# Patient Record
Sex: Female | Born: 1950 | Race: White | Hispanic: No | Marital: Single | Smoking: Never smoker
Health system: Southern US, Community
[De-identification: ages and names within clinical notes are randomized; demographics above are authoritative.]

## PROBLEM LIST (undated history)

## (undated) DIAGNOSIS — G43909 Migraine, unspecified, not intractable, without status migrainosus: Secondary | ICD-10-CM

## (undated) DIAGNOSIS — F329 Major depressive disorder, single episode, unspecified: Secondary | ICD-10-CM

## (undated) DIAGNOSIS — R87619 Unspecified abnormal cytological findings in specimens from cervix uteri: Secondary | ICD-10-CM

## (undated) DIAGNOSIS — F419 Anxiety disorder, unspecified: Secondary | ICD-10-CM

## (undated) DIAGNOSIS — C50919 Malignant neoplasm of unspecified site of unspecified female breast: Secondary | ICD-10-CM

## (undated) DIAGNOSIS — IMO0002 Reserved for concepts with insufficient information to code with codable children: Secondary | ICD-10-CM

## (undated) DIAGNOSIS — E785 Hyperlipidemia, unspecified: Secondary | ICD-10-CM

## (undated) DIAGNOSIS — F32A Depression, unspecified: Secondary | ICD-10-CM

## (undated) HISTORY — PX: TONSILLECTOMY: SUR1361

## (undated) HISTORY — DX: Hyperlipidemia, unspecified: E78.5

## (undated) HISTORY — DX: Depression, unspecified: F32.A

## (undated) HISTORY — PX: BREAST LUMPECTOMY: SHX2

## (undated) HISTORY — PX: BREAST ENHANCEMENT SURGERY: SHX7

## (undated) HISTORY — DX: Reserved for concepts with insufficient information to code with codable children: IMO0002

## (undated) HISTORY — DX: Anxiety disorder, unspecified: F41.9

## (undated) HISTORY — DX: Unspecified abnormal cytological findings in specimens from cervix uteri: R87.619

## (undated) HISTORY — DX: Malignant neoplasm of unspecified site of unspecified female breast: C50.919

## (undated) HISTORY — DX: Migraine, unspecified, not intractable, without status migrainosus: G43.909

## (undated) HISTORY — DX: Major depressive disorder, single episode, unspecified: F32.9

---

## 2006-12-28 ENCOUNTER — Encounter: Admission: RE | Admit: 2006-12-28 | Discharge: 2006-12-28 | Payer: Self-pay | Admitting: Surgery

## 2007-01-03 ENCOUNTER — Ambulatory Visit: Admission: RE | Admit: 2007-01-03 | Discharge: 2007-04-03 | Payer: Self-pay | Admitting: Radiation Oncology

## 2007-01-04 ENCOUNTER — Encounter: Admission: RE | Admit: 2007-01-04 | Discharge: 2007-01-04 | Payer: Self-pay | Admitting: Surgery

## 2007-01-09 ENCOUNTER — Ambulatory Visit (HOSPITAL_BASED_OUTPATIENT_CLINIC_OR_DEPARTMENT_OTHER): Admission: RE | Admit: 2007-01-09 | Discharge: 2007-01-09 | Payer: Self-pay | Admitting: Obstetrics and Gynecology

## 2007-01-09 ENCOUNTER — Encounter (INDEPENDENT_AMBULATORY_CARE_PROVIDER_SITE_OTHER): Payer: Self-pay | Admitting: Surgery

## 2007-01-09 ENCOUNTER — Ambulatory Visit (HOSPITAL_COMMUNITY): Admission: RE | Admit: 2007-01-09 | Discharge: 2007-01-09 | Payer: Self-pay | Admitting: Surgery

## 2007-01-24 ENCOUNTER — Ambulatory Visit: Payer: Self-pay | Admitting: Oncology

## 2007-01-31 LAB — CBC WITH DIFFERENTIAL/PLATELET
BASO%: 0.6 % (ref 0.0–2.0)
Basophils Absolute: 0 10*3/uL (ref 0.0–0.1)
EOS%: 2.4 % (ref 0.0–7.0)
Eosinophils Absolute: 0.1 10*3/uL (ref 0.0–0.5)
HCT: 37.6 % (ref 34.8–46.6)
HGB: 13.2 g/dL (ref 11.6–15.9)
LYMPH%: 35.9 % (ref 14.0–48.0)
MCH: 30.6 pg (ref 26.0–34.0)
MCHC: 35.2 g/dL (ref 32.0–36.0)
MCV: 86.8 fL (ref 81.0–101.0)
MONO#: 0.5 10*3/uL (ref 0.1–0.9)
MONO%: 8.1 % (ref 0.0–13.0)
NEUT#: 3 10*3/uL (ref 1.5–6.5)
NEUT%: 53 % (ref 39.6–76.8)
Platelets: 295 10*3/uL (ref 145–400)
RBC: 4.33 10*6/uL (ref 3.70–5.32)
RDW: 13.3 % (ref 11.3–14.5)
WBC: 5.8 10*3/uL (ref 3.9–10.0)
lymph#: 2.1 10*3/uL (ref 0.9–3.3)

## 2007-02-05 LAB — COMPREHENSIVE METABOLIC PANEL
ALT: 20 U/L (ref 0–35)
AST: 17 U/L (ref 0–37)
Albumin: 4.9 g/dL (ref 3.5–5.2)
Alkaline Phosphatase: 51 U/L (ref 39–117)
BUN: 18 mg/dL (ref 6–23)
CO2: 26 mEq/L (ref 19–32)
Calcium: 10.2 mg/dL (ref 8.4–10.5)
Chloride: 104 mEq/L (ref 96–112)
Creatinine, Ser: 0.75 mg/dL (ref 0.40–1.20)
Glucose, Bld: 86 mg/dL (ref 70–99)
Potassium: 4.2 mEq/L (ref 3.5–5.3)
Sodium: 139 mEq/L (ref 135–145)
Total Bilirubin: 0.6 mg/dL (ref 0.3–1.2)
Total Protein: 7.2 g/dL (ref 6.0–8.3)

## 2007-02-05 LAB — LACTATE DEHYDROGENASE: LDH: 182 U/L (ref 94–250)

## 2007-02-05 LAB — VITAMIN D PNL(25-HYDRXY+1,25-DIHY)-BLD
Vit D, 1,25-Dihydroxy: 43 pg/mL (ref 6–62)
Vit D, 25-Hydroxy: 29 ng/mL (ref 20–57)

## 2007-02-05 LAB — CANCER ANTIGEN 27.29: CA 27.29: 14 U/mL (ref 0–39)

## 2007-02-18 ENCOUNTER — Emergency Department (HOSPITAL_COMMUNITY): Admission: EM | Admit: 2007-02-18 | Discharge: 2007-02-18 | Payer: Self-pay | Admitting: Emergency Medicine

## 2007-05-25 ENCOUNTER — Encounter (INDEPENDENT_AMBULATORY_CARE_PROVIDER_SITE_OTHER): Payer: Self-pay | Admitting: Specialist

## 2007-05-25 ENCOUNTER — Ambulatory Visit (HOSPITAL_BASED_OUTPATIENT_CLINIC_OR_DEPARTMENT_OTHER): Admission: RE | Admit: 2007-05-25 | Discharge: 2007-05-25 | Payer: Self-pay | Admitting: Specialist

## 2007-05-25 ENCOUNTER — Ambulatory Visit: Payer: Self-pay | Admitting: Oncology

## 2007-07-30 ENCOUNTER — Ambulatory Visit: Payer: Self-pay | Admitting: Psychiatry

## 2007-08-06 ENCOUNTER — Ambulatory Visit: Payer: Self-pay | Admitting: Psychiatry

## 2007-08-20 ENCOUNTER — Ambulatory Visit: Payer: Self-pay | Admitting: Psychiatry

## 2007-09-03 ENCOUNTER — Ambulatory Visit: Payer: Self-pay | Admitting: Psychiatry

## 2007-10-18 ENCOUNTER — Ambulatory Visit: Payer: Self-pay | Admitting: Oncology

## 2007-11-27 LAB — COMPREHENSIVE METABOLIC PANEL
ALT: 18 U/L (ref 0–35)
AST: 21 U/L (ref 0–37)
Albumin: 4.5 g/dL (ref 3.5–5.2)
Alkaline Phosphatase: 56 U/L (ref 39–117)
BUN: 14 mg/dL (ref 6–23)
CO2: 28 mEq/L (ref 19–32)
Calcium: 9.8 mg/dL (ref 8.4–10.5)
Chloride: 102 mEq/L (ref 96–112)
Creatinine, Ser: 0.71 mg/dL (ref 0.40–1.20)
Glucose, Bld: 81 mg/dL (ref 70–99)
Potassium: 4.1 mEq/L (ref 3.5–5.3)
Sodium: 138 mEq/L (ref 135–145)
Total Bilirubin: 0.8 mg/dL (ref 0.3–1.2)
Total Protein: 7.2 g/dL (ref 6.0–8.3)

## 2007-11-27 LAB — CBC WITH DIFFERENTIAL/PLATELET
BASO%: 0.8 % (ref 0.0–2.0)
Basophils Absolute: 0 10*3/uL (ref 0.0–0.1)
EOS%: 1.4 % (ref 0.0–7.0)
Eosinophils Absolute: 0.1 10*3/uL (ref 0.0–0.5)
HCT: 38.1 % (ref 34.8–46.6)
HGB: 13.2 g/dL (ref 11.6–15.9)
LYMPH%: 29.2 % (ref 14.0–48.0)
MCH: 29.7 pg (ref 26.0–34.0)
MCHC: 34.7 g/dL (ref 32.0–36.0)
MCV: 85.6 fL (ref 81.0–101.0)
MONO#: 0.4 10*3/uL (ref 0.1–0.9)
MONO%: 8.1 % (ref 0.0–13.0)
NEUT#: 2.6 10*3/uL (ref 1.5–6.5)
NEUT%: 60.5 % (ref 39.6–76.8)
Platelets: 260 10*3/uL (ref 145–400)
RBC: 4.45 10*6/uL (ref 3.70–5.32)
RDW: 13.6 % (ref 11.3–14.5)
WBC: 4.3 10*3/uL (ref 3.9–10.0)
lymph#: 1.3 10*3/uL (ref 0.9–3.3)

## 2007-11-28 LAB — FOLLICLE STIMULATING HORMONE: FSH: 69.9 m[IU]/mL

## 2007-11-28 LAB — VITAMIN D 25 HYDROXY (VIT D DEFICIENCY, FRACTURES): Vit D, 25-Hydroxy: 40 ng/mL (ref 30–89)

## 2007-11-28 LAB — CANCER ANTIGEN 27.29: CA 27.29: 21 U/mL (ref 0–39)

## 2007-12-05 LAB — ESTRADIOL, ULTRA SENS

## 2008-11-17 ENCOUNTER — Ambulatory Visit: Payer: Self-pay | Admitting: Oncology

## 2008-11-19 LAB — CBC WITH DIFFERENTIAL/PLATELET
BASO%: 0.7 % (ref 0.0–2.0)
Basophils Absolute: 0 10*3/uL (ref 0.0–0.1)
EOS%: 3.3 % (ref 0.0–7.0)
Eosinophils Absolute: 0.1 10*3/uL (ref 0.0–0.5)
HCT: 37.7 % (ref 34.8–46.6)
HGB: 13.3 g/dL (ref 11.6–15.9)
LYMPH%: 26.2 % (ref 14.0–49.7)
MCH: 30.8 pg (ref 25.1–34.0)
MCHC: 35.2 g/dL (ref 31.5–36.0)
MCV: 87.4 fL (ref 79.5–101.0)
MONO#: 0.3 10*3/uL (ref 0.1–0.9)
MONO%: 7.5 % (ref 0.0–14.0)
NEUT#: 2.5 10*3/uL (ref 1.5–6.5)
NEUT%: 62.3 % (ref 38.4–76.8)
Platelets: 230 10*3/uL (ref 145–400)
RBC: 4.31 10*6/uL (ref 3.70–5.45)
RDW: 13.3 % (ref 11.2–14.5)
WBC: 4.1 10*3/uL (ref 3.9–10.3)
lymph#: 1.1 10*3/uL (ref 0.9–3.3)

## 2008-11-20 LAB — COMPREHENSIVE METABOLIC PANEL
ALT: 14 U/L (ref 0–35)
AST: 17 U/L (ref 0–37)
Albumin: 4.5 g/dL (ref 3.5–5.2)
Alkaline Phosphatase: 54 U/L (ref 39–117)
BUN: 15 mg/dL (ref 6–23)
CO2: 26 mEq/L (ref 19–32)
Calcium: 9.7 mg/dL (ref 8.4–10.5)
Chloride: 105 mEq/L (ref 96–112)
Creatinine, Ser: 0.74 mg/dL (ref 0.40–1.20)
Glucose, Bld: 54 mg/dL — ABNORMAL LOW (ref 70–99)
Potassium: 4.7 mEq/L (ref 3.5–5.3)
Sodium: 140 mEq/L (ref 135–145)
Total Bilirubin: 0.4 mg/dL (ref 0.3–1.2)
Total Protein: 6.7 g/dL (ref 6.0–8.3)

## 2008-11-20 LAB — FOLLICLE STIMULATING HORMONE: FSH: 63.5 m[IU]/mL

## 2008-11-20 LAB — VITAMIN D 25 HYDROXY (VIT D DEFICIENCY, FRACTURES): Vit D, 25-Hydroxy: 44 ng/mL (ref 30–89)

## 2009-06-01 ENCOUNTER — Ambulatory Visit (HOSPITAL_BASED_OUTPATIENT_CLINIC_OR_DEPARTMENT_OTHER): Admission: RE | Admit: 2009-06-01 | Discharge: 2009-06-01 | Payer: Self-pay | Admitting: Specialist

## 2009-07-29 ENCOUNTER — Other Ambulatory Visit: Admission: RE | Admit: 2009-07-29 | Discharge: 2009-07-29 | Payer: Self-pay | Admitting: Family Medicine

## 2009-11-13 ENCOUNTER — Ambulatory Visit: Payer: Self-pay | Admitting: Oncology

## 2009-11-16 LAB — CBC WITH DIFFERENTIAL/PLATELET
BASO%: 0.4 % (ref 0.0–2.0)
Basophils Absolute: 0 10*3/uL (ref 0.0–0.1)
EOS%: 1.1 % (ref 0.0–7.0)
Eosinophils Absolute: 0.1 10*3/uL (ref 0.0–0.5)
HCT: 36.7 % (ref 34.8–46.6)
HGB: 12.5 g/dL (ref 11.6–15.9)
LYMPH%: 25.4 % (ref 14.0–49.7)
MCH: 30.3 pg (ref 25.1–34.0)
MCHC: 34.1 g/dL (ref 31.5–36.0)
MCV: 88.9 fL (ref 79.5–101.0)
MONO#: 0.5 10*3/uL (ref 0.1–0.9)
MONO%: 6.6 % (ref 0.0–14.0)
NEUT#: 5 10*3/uL (ref 1.5–6.5)
NEUT%: 66.5 % (ref 38.4–76.8)
Platelets: 226 10*3/uL (ref 145–400)
RBC: 4.13 10*6/uL (ref 3.70–5.45)
RDW: 14.2 % (ref 11.2–14.5)
WBC: 7.6 10*3/uL (ref 3.9–10.3)
lymph#: 1.9 10*3/uL (ref 0.9–3.3)

## 2009-11-17 LAB — COMPREHENSIVE METABOLIC PANEL
ALT: 14 U/L (ref 0–35)
AST: 18 U/L (ref 0–37)
Albumin: 4.6 g/dL (ref 3.5–5.2)
Alkaline Phosphatase: 59 U/L (ref 39–117)
BUN: 13 mg/dL (ref 6–23)
CO2: 23 mEq/L (ref 19–32)
Calcium: 9.5 mg/dL (ref 8.4–10.5)
Chloride: 103 mEq/L (ref 96–112)
Creatinine, Ser: 0.9 mg/dL (ref 0.40–1.20)
Glucose, Bld: 80 mg/dL (ref 70–99)
Potassium: 4 mEq/L (ref 3.5–5.3)
Sodium: 138 mEq/L (ref 135–145)
Total Bilirubin: 0.4 mg/dL (ref 0.3–1.2)
Total Protein: 6.6 g/dL (ref 6.0–8.3)

## 2009-11-17 LAB — VITAMIN D 25 HYDROXY (VIT D DEFICIENCY, FRACTURES): Vit D, 25-Hydroxy: 47 ng/mL (ref 30–89)

## 2009-11-17 LAB — CANCER ANTIGEN 27.29: CA 27.29: 14 U/mL (ref 0–39)

## 2010-07-04 ENCOUNTER — Encounter: Payer: Self-pay | Admitting: Surgery

## 2010-09-13 LAB — POCT HEMOGLOBIN-HEMACUE: Hemoglobin: 12.6 g/dL (ref 12.0–15.0)

## 2010-10-26 NOTE — Op Note (Signed)
NAMEYUKARI, FLAX               ACCOUNT NO.:  [id number]   MEDICAL RECORD NO.:  [id number]          PATIENT TYPE:  OUT   LOCATION:  [facility]                          FACILITY:  [facility]   PHYSICIAN:  Sandria Bales. Ezzard Standing, M.D.  DATE OF BIRTH:  1950/11/27   DATE OF PROCEDURE:  01/09/2007  DATE OF DISCHARGE:                               OPERATIVE REPORT   PREOPERATIVE DIAGNOSIS:  Carcinoma, left breast, and two foci at 10:30  and 12 o'clock position.   POSTOPERATIVE DIAGNOSIS:  Carcinoma of the left breast and two foci at  10:30 and 12 o'clock position.   PROCEDURE:  Injection of methylene blue approximately 2 mL of 40%  methylene blue, left sentinel lymph node biopsy, left breast lumpectomy  with needle localization x2.   ANESTHESIA:  General laryngeal mask airway with 18 mL of 0.25% Marcaine.   COMPLICATIONS:  None.   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   She has had prior breast implants which are subpectoral placed by Dr.  Marijean Niemann.  She is interested in preserving her breast implants  and trying a lumpectomy.   The indications, potential complications explained to the patient.  The  potential complications include but are not limited to bleeding,  infection, residual cancer, and incomplete excision.   I also discussed with her if we did a needle localization that if the  sentinel lymph node was positive, she would have a full axillary node  dissection.  She understands the risks of lymphedema, axillary nerve  injury.   OPERATIVE NOTE:  The patient had two wires placed in the upper aspect of  her left breast with Xs over the approximate tumors by Dr. Isaiah Serge.  I  used these as my guides for my excision.  I prepped her left breast and  axilla with Betadine  solution, gave her 1 gram of Ancef and initiated  the procedure.  A timeout was held identifying the procedure and the  patient.   I started with the breast itself.  I excised and I basically did a  quadrantectomy of the upper inner aspect of her left breast taking out  all the way down to the chest wall.  Again the implant was subpectoral  so I did not see the implant.  I went medial to the lateral edge of the  sternum, superiorly to about 2 fingerbreadths below the clavicle.  I  marked this specimen with a long suture cranial, short suture medial.  She has the guide wires coming out through the anterior portion of the  skin noting this.  I sent the specimen to Dr. Isaiah Serge to confirm the  metal markers had been excised and Dr. Guerry Bruin did touch preps  confirming that the touch preps were negative.   I then turned my attention to the axillary lymph node, identified, made  an incision in the  left axilla, carried it down to the axillary fat pad.  She had a 1 millicurie of technitium tagged sulfa colloid injection at  the initiation of procedure.  Again I used 2 mL of 40% methylene blue to  inject subareolar.   I made an incision in the lower axilla and I found a lymph node, which  was hot and blue (with counts of 600 in a background of 5).  I excised  the lymph node for  touch prep.  Touch prep reported by Dr. Nonda Lou  showed no cancer.   After getting complete reports, I then irrigated both wounds, closed  them with 3-0 Vicryl suture and a subcuticular suture, 5-0 Monocryl  suturing the skin, painted with tincture of benzoin and Steri-Stripped  them.   Ms. Mangal will have somewhat of a defect in the upper aspect of her left  breast since I did this quadrant resection of her breast.  I have to see  what the final pathology was and how she does as to whether she will  need any further surgery there.  Again, the left axilla surgery went  well.  Sponge and needle count were correct  at the end of the case.   She will be discharged home today, return to see me in one week for her  followup pathology.  She will call for any interval problem.      Sandria Bales. Ezzard Standing, M.D.  Electronically Signed     DHN/MEDQ  D:  01/09/2007  T:  01/09/2007  Job:  [id number]   cc:   Clovis Riley, NP  Lorra Hals, M.D.

## 2010-10-26 NOTE — Op Note (Signed)
Andrea Villa, Andrea Villa               ACCOUNT NO.:  [id number]   MEDICAL RECORD NO.:  [id number]          PATIENT TYPE:  AMB   LOCATION:  [facility]                          FACILITY:  [facility]   PHYSICIAN:  Earvin Hansen L. Shon Hough, M.D.DATE OF BIRTH:  10/11/1950   DATE OF PROCEDURE:  05/25/2007  DATE OF DISCHARGE:  05/25/2007                               OPERATIVE REPORT   HISTORY:  A 60 year old lady with biopsy proven left breast cancer.  The  patient had a lumpectomy, radical lumpectomy with radiation treatments  and chemotherapy.  Now has total asymmetry involving her right and left  breast areas with the right breast being completely larger.  She had  breast augmentation in 1995.  Plans, remove implant on the left side,  replace the tissue expansion to expand up the discrepancy of the tissue  loss on the left side as compared to the right after the lumpectomy and  irradiation treatments.  The procedure in detail were explained to the  patient preoperatively.  The patient understands and consents to  surgery.  She understands the possible risks and complications.   SURGEON:  Yaakov Guthrie. Shon Hough, M.D.   PROCEDURE IN DETAIL:  The patient was sat up and drawn for the midline  of the chest.  She underwent general anesthesia and intubated orally.  Prep was done to the chest and breast areas with Betadine soap and  solution and walled off with sterile towels and drapes such as to make a  sterile field.  Previous supra-areolar incision was entered after 0.5%  Xylocaine with epinephrine was injected locally for vasoconstriction.  Dissection was carried down through skin, subcutaneous tissue, breast  tissue to underlying pectoralis major muscle.  The muscle was divided in  the rectus fibers to an underlying implant which was removed.  It was a  total saline implant and had great integrity, no problems.  It measured  300 mL.  Next, we did some limited capsulotomies over the medial portion  as well as  the radiation area incision area in the upper quadrant.  After proper hemostasis the tissue expander Mentor type Spectrum  reference number [id number] was placed in.  I was able to place back 500 mL  within it without any problems with excellent symmetry.  We put the port  in the left lateral breast axillary region and secured it with 3-0  Vicryl.  Next the muscles repaired back with 2-0 Monocryl.  Subcutaneous  tissue 2-0 Monocryl x2 layers and running subcuticular stitch of 3-0  Monocryl.  At the end of procedure with good symmetry which did have a  ways to go in the upper quadrant medially but we will allow the tissues  to soften and stretch more.  I feel we can get very, very close to the  symmetry and shape of the right breast.  The patient was taken to  recovery after sterile dressings were placed including Xeroform, 4x4s,  ABDs, Hypafix tape.  Estimated blood loss less than 150 mL.  Complications none.      Yaakov Guthrie. Shon Hough, M.D.  Electronically Signed  GLT/MEDQ  D:  05/25/2007  T:  05/26/2007  Job:  [id number]

## 2011-03-21 LAB — POCT HEMOGLOBIN-HEMACUE
Hemoglobin: 12.4
Operator id: 128471

## 2011-03-25 LAB — POCT CARDIAC MARKERS
CKMB, poc: 1.2
Myoglobin, poc: 36.7
Operator id: 4074
Troponin i, poc: <0.05

## 2011-03-25 LAB — CBC
HCT: 37.4
Hemoglobin: 12.8
MCHC: 34.3
MCV: 87
Platelets: 270
RBC: 4.31
RDW: 14
WBC: 9

## 2011-03-25 LAB — DIFFERENTIAL
Basophils Absolute: 0
Basophils Relative: 0
Eosinophils Absolute: 0
Eosinophils Relative: 0
Lymphocytes Relative: 11 — ABNORMAL LOW
Lymphs Abs: 1
Monocytes Absolute: 0.8 — ABNORMAL HIGH
Monocytes Relative: 9
Neutro Abs: 7.2
Neutrophils Relative %: 80 — ABNORMAL HIGH

## 2011-03-25 LAB — BASIC METABOLIC PANEL
BUN: 10
CO2: 27
Calcium: 9.6
Chloride: 103
Creatinine, Ser: 0.55
GFR calc Af Amer: >60
GFR calc non Af Amer: >60
Glucose, Bld: 107 — ABNORMAL HIGH
Potassium: 3.8
Sodium: 137

## 2011-03-25 LAB — PROTIME-INR
INR: 1
Prothrombin Time: 13.4

## 2011-03-25 LAB — APTT: aPTT: 25

## 2011-03-28 LAB — DIFFERENTIAL
Basophils Absolute: 0
Basophils Relative: 1
Eosinophils Absolute: 0.1
Eosinophils Relative: 1
Lymphocytes Relative: 37
Lymphs Abs: 1.9
Monocytes Absolute: 0.4
Monocytes Relative: 8
Neutro Abs: 2.8
Neutrophils Relative %: 54

## 2011-03-28 LAB — COMPREHENSIVE METABOLIC PANEL
ALT: 31
AST: 27
Albumin: 4.3
Alkaline Phosphatase: 39
BUN: 10
CO2: 29
Calcium: 9.7
Chloride: 104
Creatinine, Ser: 0.63
GFR calc Af Amer: >60
GFR calc non Af Amer: >60
Glucose, Bld: 91
Potassium: 4.3
Sodium: 139
Total Bilirubin: 0.5
Total Protein: 6.6

## 2011-03-28 LAB — CBC
HCT: 38
Hemoglobin: 12.9
MCHC: 33.8
MCV: 88.1
Platelets: 260
RBC: 4.31
RDW: 13.3
WBC: 5.3

## 2011-08-03 ENCOUNTER — Encounter: Payer: Self-pay | Admitting: Oncology

## 2011-08-03 NOTE — Progress Notes (Signed)
Andrea Villa called because she wants to apply for financial assistance. Andrea Villa will come in tomorrow with the most recent (30days)  proof of income and bank statement to apply for assistance.

## 2011-08-04 ENCOUNTER — Encounter: Payer: Self-pay | Admitting: Oncology

## 2011-08-04 NOTE — Progress Notes (Signed)
Pt came in with proof of income and bank statement. Advised Andrea Villa we will process her application and will follow up with a letter for her status.

## 2011-08-19 ENCOUNTER — Encounter: Payer: Self-pay | Admitting: Oncology

## 2011-09-12 ENCOUNTER — Encounter: Payer: Self-pay | Admitting: Oncology

## 2011-09-12 NOTE — Progress Notes (Signed)
Patient approved for 100% EPP discount, for family of 1 income [phone]

## 2011-09-19 ENCOUNTER — Other Ambulatory Visit (HOSPITAL_BASED_OUTPATIENT_CLINIC_OR_DEPARTMENT_OTHER): Payer: Self-pay | Admitting: Lab

## 2011-09-19 ENCOUNTER — Encounter: Payer: Self-pay | Admitting: Oncology

## 2011-09-19 DIAGNOSIS — C50219 Malignant neoplasm of upper-inner quadrant of unspecified female breast: Secondary | ICD-10-CM

## 2011-09-19 DIAGNOSIS — M899 Disorder of bone, unspecified: Secondary | ICD-10-CM

## 2011-09-19 DIAGNOSIS — Z170 Estrogen receptor positive status [ER+]: Secondary | ICD-10-CM

## 2011-09-19 LAB — CBC WITH DIFFERENTIAL/PLATELET
BASO%: 0.9 % (ref 0.0–2.0)
Basophils Absolute: 0 10*3/uL (ref 0.0–0.1)
EOS%: 1.4 % (ref 0.0–7.0)
Eosinophils Absolute: 0.1 10*3/uL (ref 0.0–0.5)
HCT: 36.4 % (ref 34.8–46.6)
HGB: 12.3 g/dL (ref 11.6–15.9)
LYMPH%: 38.3 % (ref 14.0–49.7)
MCH: 29.8 pg (ref 25.1–34.0)
MCHC: 33.8 g/dL (ref 31.5–36.0)
MCV: 88.3 fL (ref 79.5–101.0)
MONO#: 0.4 10*3/uL (ref 0.1–0.9)
MONO%: 7.1 % (ref 0.0–14.0)
NEUT#: 2.7 10*3/uL (ref 1.5–6.5)
NEUT%: 52.3 % (ref 38.4–76.8)
Platelets: 236 10*3/uL (ref 145–400)
RBC: 4.13 10*6/uL (ref 3.70–5.45)
RDW: 13.8 % (ref 11.2–14.5)
WBC: 5.1 10*3/uL (ref 3.9–10.3)
lymph#: 2 10*3/uL (ref 0.9–3.3)
nRBC: 0 % (ref 0–0)

## 2011-09-19 NOTE — Progress Notes (Signed)
Patient came by my office today inquiring about not recieveing any news as to whether she qualified for Financial assistance. I let the patient know that I had mailed her a letter out on 09/12/11 she stated that she had not received the letter so we went over address. Patient address had changed so we corrected the address but I gave her the green card today for 100% discount.

## 2011-09-20 LAB — COMPREHENSIVE METABOLIC PANEL
ALT: 12 U/L (ref 0–35)
AST: 16 U/L (ref 0–37)
Albumin: 4.2 g/dL (ref 3.5–5.2)
Alkaline Phosphatase: 57 U/L (ref 39–117)
BUN: 13 mg/dL (ref 6–23)
CO2: 26 mEq/L (ref 19–32)
Calcium: 9.7 mg/dL (ref 8.4–10.5)
Chloride: 105 mEq/L (ref 96–112)
Creatinine, Ser: 0.69 mg/dL (ref 0.50–1.10)
Glucose, Bld: 96 mg/dL (ref 70–99)
Potassium: 3.9 mEq/L (ref 3.5–5.3)
Sodium: 137 mEq/L (ref 135–145)
Total Bilirubin: 0.4 mg/dL (ref 0.3–1.2)
Total Protein: 6.6 g/dL (ref 6.0–8.3)

## 2011-09-20 LAB — CANCER ANTIGEN 27.29: CA 27.29: 21 U/mL (ref 0–39)

## 2011-09-20 LAB — VITAMIN D 25 HYDROXY (VIT D DEFICIENCY, FRACTURES): Vit D, 25-Hydroxy: 58 ng/mL (ref 30–89)

## 2011-09-26 ENCOUNTER — Ambulatory Visit (HOSPITAL_BASED_OUTPATIENT_CLINIC_OR_DEPARTMENT_OTHER): Payer: Self-pay | Admitting: Oncology

## 2011-09-26 VITALS — BP 110/64 | HR 87 | Temp 98.5°F | Ht 60.0 in | Wt 134.5 lb

## 2011-09-26 DIAGNOSIS — Z170 Estrogen receptor positive status [ER+]: Secondary | ICD-10-CM

## 2011-09-26 DIAGNOSIS — C50219 Malignant neoplasm of upper-inner quadrant of unspecified female breast: Secondary | ICD-10-CM

## 2011-09-26 DIAGNOSIS — C50919 Malignant neoplasm of unspecified site of unspecified female breast: Secondary | ICD-10-CM | POA: Insufficient documentation

## 2011-09-26 NOTE — Progress Notes (Signed)
ID: Andrea Villa   DOB: January 26, 1951  MR#: [id number]  NWG#:[id number]  HISTORY OF PRESENT ILLNESS:  She had a screening mammogram at [facility] on December 05, 2006 which showed a focal area of asymmetry in the left breast.  This was changed to a diagnostic mammogram and then additional views showed a 1.7 cm to 2 cm lesion, which by ultrasound was not demonstrable.  Breast specific gamma imaging was according performed on June 25 and showed a low level focal isotope activity in two adjacent areas just anterior to the patient's breast implant.  Ultrasound guided core biopsy was performed on June 25 and showed ([id number]).  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  Clips were placed at the time of the procedure and on July 17 the patient underwent bilateral breast MRIs.  Again in the upper inner quadrant of the left breast there were two enhancing masses measuring maximally 1.1 cm each, otherwise the axilla and the right breast were unremarkable.  With this information on July 29, Andrea Villa proceeded to left lumpectomy and sentinel lymph node biopsy with a final pathology ([id number]) showing two tubular breast cancers measuring 9 mm and 11 mm, both grade 1 with no evidence of lymphovascular invasion with 0 of 1 sentinel lymph node involved with ample margins and both estrogen receptor positive (90 and 99%), progesterone receptor positive (6 and 99%), with a low proliferation fraction (7 and 9%), and both negative for HER-2/neu by Hercep test (both 0). Her subsequent history is as detailed below  INTERVAL HISTORY: She has not been here in more than a year and is also behind on her mammography because of financial reasons. Family is fine, and the interval history is otherwise unremarkable  REVIEW OF SYSTEMS: No unusual headaches nausea vomiting change in bowel or bladder habits pain unexplained weight loss unexplained fatigue, fever or rash bleeding or or other symptoms of concern. She has  developed mild hidradenitis bilaterally. Otherwise detailed review of systems today was noncontributory  PAST MEDICAL HISTORY: Significant for bilateral breast implant placement under Dr. Marijean Niemann.  She is status post removal of precancerous skin lesion from the abdomen in 1995.  I do not have that pathology.  She is status post tonsillectomy and adenoidectomy, history of migraines, history of palpitations, and history of hypercholesterolemia.  FAMILY HISTORY The patient's father died from alcoholism in his 25s.  The patient's mother is alive at age 71.  She lives in [place].  The patient has two sisters and five brothers.  The patient's mother had breast cancer and is doing fine.  This was postmenopausal diagnosis.  There is no other breast or ovarian cancer in the family.  GYNECOLOGIC HISTORY: She is G6, P3, first pregnancy at age 48.  Last menstrual period about eight years ago.  She took oral contraceptive for several years and then low estrogen and then Prempro, which she just quit at the time of her breast cancer diagnosis.   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  She is divorced and lives by herself.  Her daughter Andrea Villa in [place] works at [employer].  In general, her son Andrea Villa in [place] works at a labeling company and her daughter Andrea Villa in [place] works for a company the name of which she does not remember.  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  The patient has a granddaughter that she is very fond  of.  She was brought up as a baptist, but is not currently a church attender.:     ADVANCED DIRECTIVES:  HEALTH MAINTENANCE: History  Substance Use Topics  . Smoking status: Not on file  . Smokeless tobacco: Not on file  . Alcohol Use: Not on file     No Known Allergies  Current Outpatient Prescriptions  Medication Sig Dispense Refill  . anastrozole (ARIMIDEX) 1 MG  tablet Take 1 mg by mouth daily.      Marland Kitchen buPROPion (WELLBUTRIN SR) 150 MG 12 hr tablet Take 150 mg by mouth 2 (two) times daily.      . Calcium Carbonate-Vitamin D (CALCIUM + D PO) Take by mouth daily. Citracal      . cetirizine (ZYRTEC) 10 MG tablet Take 10 mg by mouth daily.      Marland Kitchen HYDROcodone-acetaminophen (NORCO) 5-325 MG per tablet Take 1 tablet by mouth every 6 (six) hours as needed.      Marland Kitchen ibuprofen (ADVIL,MOTRIN) 200 MG tablet Take 200 mg by mouth as needed.      . rosuvastatin (CRESTOR) 10 MG tablet Take 10 mg by mouth daily.      . sertraline (ZOLOFT) 100 MG tablet Take 100 mg by mouth daily.      . SUMAtriptan (IMITREX) 100 MG tablet Take 100 mg by mouth as needed.      Marland Kitchen VITAMIN D, CHOLECALCIFEROL, PO Take 10,000 Units by mouth. 2-3 daily, per pt        OBJECTIVE: Middle-aged white woman in no acute distress Filed Vitals:   09/26/11 1444  BP: 110/64  Pulse: 87  Temp: 98.5 F (36.9 C)     Body mass index is 26.27 kg/(m^2).    ECOG FS: 0  Sclerae unicteric Oropharynx clear No peripheral adenopathy Lungs no rales or rhonchi Heart regular rate and rhythm Abd benign MSK no focal spinal tenderness, no peripheral edema Neuro: nonfocal Breasts: Both breasts have implants in place; right breast is unremarkable; left breast is status post lumpectomy and radiation. There is no evidence of local recurrence  LAB RESULTS: Lab Results  Component Value Date   WBC 5.1 09/19/2011   NEUTROABS 2.7 09/19/2011   HGB 12.3 09/19/2011   HCT 36.4 09/19/2011   MCV 88.3 09/19/2011   PLT 236 09/19/2011      Chemistry      Component Value Date/Time   NA 137 09/19/2011 1326   K 3.9 09/19/2011 1326   CL 105 09/19/2011 1326   CO2 26 09/19/2011 1326   BUN 13 09/19/2011 1326   CREATININE 0.69 09/19/2011 1326      Component Value Date/Time   CALCIUM 9.7 09/19/2011 1326   ALKPHOS 57 09/19/2011 1326   AST 16 09/19/2011 1326   ALT 12 09/19/2011 1326   BILITOT 0.4 09/19/2011 1326       Lab Results  Component Value  Date   LABCA2 21 09/19/2011    No components found with this basename: LABCA125    No results found for this basename: INR:1;PROTIME:1 in the last 168 hours  Urinalysis No results found for this basename: colorurine, appearanceur, labspec, phurine, glucoseu, hgbur, bilirubinur, ketonesur, proteinur, urobilinogen, nitrite, leukocytesur    STUDIES: No new results found.  ASSESSMENT: 61 year old [place] woman, status post left lumpectomy and sentinel lymph node biopsy July of 2008 for a multifocal tubular breast cancer measuring maximally 1.1 cm, with ample margins and no lymphovascular invasion.  There was no lymph node involvement.  The tumor was strongly  ER and PR+, HER2-.  She completed radiation in mid-October of 2008, and started Arimidex at that time.   PLAN: She is doing very well with no evidence of recurrence of her breast cancer. She remains at very low risk. I have advised her to continue the anastrozole through October. She does need to update her mammogram and I gave her the new number for the BCCCP program for her to contact. At this point, I am comfortable releasing her to her primary care physician. That he knows I will be glad to see her anytime in the future  if the need arises.   Anandi Abramo C    09/26/2011

## 2011-10-18 ENCOUNTER — Ambulatory Visit (INDEPENDENT_AMBULATORY_CARE_PROVIDER_SITE_OTHER): Payer: Self-pay | Admitting: *Deleted

## 2011-10-18 VITALS — BP 125/85 | HR 75 | Temp 97.5°F | Ht 60.0 in | Wt 132.7 lb

## 2011-10-18 DIAGNOSIS — Z1239 Encounter for other screening for malignant neoplasm of breast: Secondary | ICD-10-CM

## 2011-10-18 NOTE — Patient Instructions (Signed)
Taught patient how to perform BSE. Patient did not need a Pap smear today due to last Pap smear was 07/29/09. Let her know B[facility]CP will cover Pap smears every 3 years unless has a history of abnormal Pap smears. Patient referred to [facility] for bilateral Diagnostic Mammogram due to history of lumpectomy in 2008. Appointment scheduled for Monday, Oct 24, 2011 at 0830.  Patient aware of appointment and will be there. Let patient know will follow up with her within the next couple weeks with results. Patient verbalized understanding.

## 2011-10-18 NOTE — Progress Notes (Addendum)
No complaints today.  Pap Smear:    Pap smear not performed today. Patients last Pap smear was 07/29/09 and was normal. Patient has a history of an abnormal Pap smear 12/02/03 that was ASCUS and a repeat Pap smear was recommended. Pap smear 09/20/05 and 10/31/06 were normal. Pap smear results above are in EPIC.  Physical exam: Breasts Breasts symmetrical. No skin abnormalities right breast. Scar on left breast at 10 o'clock where patient had a lumpectomy in 2008 for a history of breast cancer. No nipple retraction bilateral breasts. No nipple discharge bilateral breasts. No lymphadenopathy. No lumps palpated bilateral breasts. No complaints of pain or tenderness on exam. Patient referred to [facility] for bilateral Diagnostic Mammogram due to history of lumpectomy in 2008. Appointment scheduled for Monday, Oct 24, 2011 at 0830.         Pelvic/Bimanual No Pap smear completed today since last Pap smear was 07/29/09 and normal. Pap smear not indicated per BCCCP guidelines.

## 2011-10-19 ENCOUNTER — Other Ambulatory Visit: Payer: Self-pay | Admitting: *Deleted

## 2011-10-19 DIAGNOSIS — C50919 Malignant neoplasm of unspecified site of unspecified female breast: Secondary | ICD-10-CM

## 2011-10-19 MED ORDER — ANASTROZOLE 1 MG PO TABS: 1.0000 mg | ORAL_TABLET | Freq: Every day | ORAL | Status: DC

## 2011-11-21 ENCOUNTER — Telehealth: Payer: Self-pay | Admitting: *Deleted

## 2011-11-21 NOTE — Telephone Encounter (Signed)
Patient given results of diagnostic mammogram in the office at [facility] 11/01/11. Patient told will need a follow up diagnostic mammogram in 1 year. Patient verbalized understanding.

## 2011-12-20 ENCOUNTER — Other Ambulatory Visit: Payer: Self-pay

## 2011-12-27 ENCOUNTER — Ambulatory Visit: Payer: Self-pay | Admitting: Oncology

## 2012-01-17 ENCOUNTER — Telehealth: Payer: Self-pay | Admitting: Medical Oncology

## 2012-01-17 ENCOUNTER — Other Ambulatory Visit: Payer: Self-pay | Admitting: Medical Oncology

## 2012-01-17 DIAGNOSIS — C50919 Malignant neoplasm of unspecified site of unspecified female breast: Secondary | ICD-10-CM

## 2012-01-17 MED ORDER — ANASTROZOLE 1 MG PO TABS: 1.0000 mg | ORAL_TABLET | Freq: Every day | ORAL | Status: DC

## 2012-01-17 NOTE — Telephone Encounter (Signed)
We received a refill for arimidex and it states the pt reports the medications has be discontinued. According to Dr. Darrall Dears last office note pt is to continue taking thru October then discontinue. I attempted to call the pt without success but I did fax this information with [facility].

## 2013-01-22 ENCOUNTER — Ambulatory Visit (HOSPITAL_COMMUNITY): Payer: Self-pay

## 2013-02-05 ENCOUNTER — Encounter (HOSPITAL_COMMUNITY): Payer: Self-pay

## 2013-02-05 ENCOUNTER — Ambulatory Visit (HOSPITAL_COMMUNITY)
Admission: RE | Admit: 2013-02-05 | Discharge: 2013-02-05 | Disposition: A | Discharge status: Home / Self Care | Payer: Self-pay | Source: Ambulatory Visit | Attending: Obstetrics and Gynecology | Admitting: Obstetrics and Gynecology

## 2013-02-05 VITALS — BP 104/60 | Temp 98.3°F | Ht 60.0 in | Wt 137.4 lb

## 2013-02-05 DIAGNOSIS — Z01419 Encounter for gynecological examination (general) (routine) without abnormal findings: Secondary | ICD-10-CM

## 2013-02-05 NOTE — Addendum Note (Signed)
Encounter addended by: Saintclair Halsted, RN on: 02/05/2013  1:09 PM<BR>     Documentation filed: Visit Diagnoses, Notes Section

## 2013-02-05 NOTE — Progress Notes (Addendum)
No complaints today.  Pap Smear:    Pap smear completed today. Patients last Pap smear was 11/10/2006 at the [facility] and normal. Patient has a history of a ASCUS Pap smear 12/02/2003 that required a follow up Pap smear that was normal. Pap smear results above are in EPIC under media.  Physical exam: Breasts Patient has a history of breast cancer and left breast lumpectomy in 2008. Breasts symmetrical. Patient has a history of bilateral breast implants placed in 2009. Skin discoloration observed left breast from history of radiation treatments. No nipple retraction bilateral breasts. No nipple discharge bilateral breasts. No lymphadenopathy. No lumps palpated bilateral breasts. Referred patient to [facility] for diagnostic mammogram per recommendation. Appointment scheduled for Thursday, February 07, 2013 at 0800.         Pelvic/Bimanual   Ext Genitalia No lesions, no swelling and no discharge observed on external genitalia.         Vagina Vagina pink and normal texture. No lesions or discharge observed in vagina.          Cervix Cervix is present. Cervix pink with a reddened friable area around cervical os. No discharge observed.     Uterus Uterus is present and palpable. Uterus in normal position and normal size.        Adnexae Bilateral ovaries present and palpable. No tenderness on palpation.          Rectovaginal No rectal exam completed today since patient had no rectal complaints. Large hemorrhoid observed on rectal area.

## 2013-02-05 NOTE — Patient Instructions (Signed)
Taught Pincus Large how to perform BSE and gave educational materials to take home. Let her know [facility]CP will cover Pap smears every 3 years unless has a history of abnormal Pap smears. Referred patient to [facility] for diagnostic mammogram per recommendation. Appointment scheduled for Thursday, February 07, 2013 at 0800. Patient aware of appointment and will be there. Let patient know will follow up with her within the next couple weeks with results by phone. Pincus Large verbalized understanding.  Kasy Iannacone, Kathaleen Maser, RN 12:52 PM

## 2013-02-07 ENCOUNTER — Telehealth (HOSPITAL_COMMUNITY): Payer: Self-pay | Admitting: *Deleted

## 2013-02-07 NOTE — Telephone Encounter (Signed)
Telephoned patient at home # and discussed negative pap smear results. Next pap due in 3 years. Patient voiced understanding.  

## 2014-02-28 DIAGNOSIS — Z853 Personal history of malignant neoplasm of breast: Secondary | ICD-10-CM | POA: Diagnosis not present

## 2014-02-28 DIAGNOSIS — R928 Other abnormal and inconclusive findings on diagnostic imaging of breast: Secondary | ICD-10-CM | POA: Diagnosis not present

## 2014-03-24 DIAGNOSIS — Z5181 Encounter for therapeutic drug level monitoring: Secondary | ICD-10-CM | POA: Diagnosis not present

## 2014-03-24 DIAGNOSIS — E663 Overweight: Secondary | ICD-10-CM | POA: Diagnosis not present

## 2014-03-24 DIAGNOSIS — E782 Mixed hyperlipidemia: Secondary | ICD-10-CM | POA: Diagnosis not present

## 2014-03-26 ENCOUNTER — Other Ambulatory Visit (HOSPITAL_COMMUNITY)
Admission: RE | Admit: 2014-03-26 | Discharge: 2014-03-26 | Disposition: A | Discharge status: Home / Self Care | Payer: Medicare Other | Source: Ambulatory Visit | Attending: Family Medicine | Admitting: Family Medicine

## 2014-03-26 ENCOUNTER — Other Ambulatory Visit: Payer: Self-pay | Admitting: Family Medicine

## 2014-03-26 DIAGNOSIS — Z1151 Encounter for screening for human papillomavirus (HPV): Secondary | ICD-10-CM | POA: Insufficient documentation

## 2014-03-26 DIAGNOSIS — F419 Anxiety disorder, unspecified: Secondary | ICD-10-CM | POA: Diagnosis not present

## 2014-03-26 DIAGNOSIS — Z23 Encounter for immunization: Secondary | ICD-10-CM | POA: Diagnosis not present

## 2014-03-26 DIAGNOSIS — H409 Unspecified glaucoma: Secondary | ICD-10-CM | POA: Diagnosis not present

## 2014-03-26 DIAGNOSIS — E663 Overweight: Secondary | ICD-10-CM | POA: Diagnosis not present

## 2014-03-26 DIAGNOSIS — Z853 Personal history of malignant neoplasm of breast: Secondary | ICD-10-CM | POA: Diagnosis not present

## 2014-03-26 DIAGNOSIS — F329 Major depressive disorder, single episode, unspecified: Secondary | ICD-10-CM | POA: Diagnosis not present

## 2014-03-26 DIAGNOSIS — Z0001 Encounter for general adult medical examination with abnormal findings: Secondary | ICD-10-CM | POA: Diagnosis not present

## 2014-03-26 DIAGNOSIS — Z124 Encounter for screening for malignant neoplasm of cervix: Secondary | ICD-10-CM | POA: Insufficient documentation

## 2014-03-26 DIAGNOSIS — E782 Mixed hyperlipidemia: Secondary | ICD-10-CM | POA: Diagnosis not present

## 2014-03-27 LAB — CYTOLOGY - PAP

## 2014-04-14 ENCOUNTER — Encounter (HOSPITAL_COMMUNITY): Payer: Self-pay

## 2014-04-17 ENCOUNTER — Encounter: Payer: Self-pay | Admitting: *Deleted

## 2014-04-18 ENCOUNTER — Ambulatory Visit (INDEPENDENT_AMBULATORY_CARE_PROVIDER_SITE_OTHER): Payer: Medicare Other | Admitting: Cardiology

## 2014-04-18 ENCOUNTER — Encounter: Payer: Self-pay | Admitting: Cardiology

## 2014-04-18 VITALS — BP 124/82 | HR 60 | Ht 59.75 in | Wt 141.8 lb

## 2014-04-18 DIAGNOSIS — R9431 Abnormal electrocardiogram [ECG] [EKG]: Secondary | ICD-10-CM | POA: Insufficient documentation

## 2014-04-18 DIAGNOSIS — R002 Palpitations: Secondary | ICD-10-CM

## 2014-04-18 DIAGNOSIS — N951 Menopausal and female climacteric states: Secondary | ICD-10-CM | POA: Diagnosis not present

## 2014-04-18 NOTE — Progress Notes (Signed)
HPI The patient presents for new patient evaluation. She did have stress testing years ago for some atypical symptoms. She has otherwise not had any cardiovascular complaints. She was referred for evaluation of some palpitations. Also she has an abnormal EKG. This demonstrates T-wave inversions in the inferior leads. This is apparently new although I do not have an old EKG. She has rarely gotten some chest discomfort or dyspnea with exertion. Perhaps the dyspnea with exertion is more than she used to have. She was having some palpitations particularly a few weeks ago. She would feel her heart skipping and pause. She did not describe sustained tachypalpitations.  She did not have presyncope or syncope. She has had no new shortness of breath, PND or orthopnea. She has had slow weight gain over the years but no edema.   No Known Allergies  Current Outpatient Prescriptions  Medication Sig Dispense Refill  . bimatoprost (LUMIGAN) 0.03 % ophthalmic solution 1 drop at bedtime.    Marland Kitchen buPROPion (WELLBUTRIN SR) 150 MG 12 hr tablet Take 150 mg by mouth 2 (two) times daily.    . Calcium Carbonate-Vitamin D (CALCIUM + D PO) Take by mouth daily. Citracal    . cetirizine (ZYRTEC) 10 MG tablet Take 10 mg by mouth daily.    . dorzolamide-timolol (COSOPT) 22.3-6.8 MG/ML ophthalmic solution Place 1 drop into both eyes 2 (two) times daily.    Marland Kitchen HYDROcodone-acetaminophen (NORCO) 5-325 MG per tablet Take 1 tablet by mouth every 6 (six) hours as needed.    Marland Kitchen ibuprofen (ADVIL,MOTRIN) 200 MG tablet Take 200 mg by mouth as needed.    . meloxicam (MOBIC) 15 MG tablet Take 15 mg by mouth as needed for pain.    . rizatriptan (MAXALT) 10 MG tablet Take 10 mg by mouth as needed for migraine. May repeat in 2 hours if needed    . rosuvastatin (CRESTOR) 10 MG tablet Take 10 mg by mouth daily.    . sertraline (ZOLOFT) 100 MG tablet Take 100 mg by mouth daily.    Marland Kitchen VITAMIN D, CHOLECALCIFEROL, PO Take 10,000 Units by mouth. 2-3  daily, per pt     No current facility-administered medications for this visit.    Past Medical History  Diagnosis Date  . Abnormal Pap smear   . Hyperlipidemia   . Breast cancer   . Mental disorder     anxiety and depression    Past Surgical History  Procedure Laterality Date  . Breast enhancement surgery    . Breast lumpectomy    . Tonsillectomy      Family History  Problem Relation Age of Onset  . Diabetes Paternal Grandfather   . Breast cancer Mother   . Cancer Brother     liver    History   Social History  . Marital Status: Single    Spouse Name: N/A    Number of Children: N/A  . Years of Education: N/A   Occupational History  . Not on file.   Social History Main Topics  . Smoking status: Never Smoker   . Smokeless tobacco: Never Used  . Alcohol Use: 2.4 oz/week    4 Glasses of wine per week  . Drug Use: No  . Sexual Activity: Yes    Birth Control/ Protection: None   Other Topics Concern  . Not on file   Social History Narrative    ROS:  As stated in the HPI and negative for all other systems.  PHYSICAL EXAM BP 124/82  mmHg  Pulse 60  Ht 4' 11.75" (1.518 m)  Wt 141 lb 12.8 oz (64.32 kg)  BMI 27.91 kg/m2  GENERAL:  Well appearing HEENT:  Pupils equal round and reactive, fundi not visualized, oral mucosa unremarkable NECK:  No jugular venous distention, waveform within normal limits, carotid upstroke brisk and symmetric, no bruits, no thyromegaly LYMPHATICS:  No cervical, inguinal adenopathy LUNGS:  Clear to auscultation bilaterally BACK:  No CVA tenderness CHEST:  Unremarkable HEART:  PMI not displaced or sustained,S1 and S2 within normal limits, no S3, no S4, no clicks, no rubs, no murmurs ABD:  Flat, positive bowel sounds normal in frequency in pitch, no bruits, no rebound, no guarding, no midline pulsatile mass, no hepatomegaly, no splenomegaly EXT:  2 plus pulses throughout, no edema, no cyanosis no clubbing SKIN:  No rashes no  nodules NEURO:  Cranial nerves II through XII grossly intact, motor grossly intact throughout PSYCH:  Cognitively intact, oriented to person place and time  EKG:  This rhythm, rate 60, axis within normal limits, intervals within normal limits, anterolateral T-wave inversions. No old EKG for comparison.  04/18/2014   ASSESSMENT AND PLAN  ABNORMAL EKG:  Her EKG is abnormal and she does have significant risk factors particularly with her family history. She needs stress testing to rule out obstructive coronary disease. However, her baseline EKG would preclude adequate analysis with a treadmill alone. Therefore, I will order an exercise echocardiogram.  Of note we spent quite a bit of time discussing primary risk reduction.  PALPITATIONS:  We discussed these at length. These are mild. At this point these are not particularly symptomatic. She would prefer to not have specific therapy for this. She might reduce the amount of caffeine she drinks which is at least one caffeinated beverage during the day.

## 2014-04-18 NOTE — Patient Instructions (Signed)
Your physician has requested that you have a stress echocardiogram. For further information please visit [email or web address]. Please follow instruction sheet as given.    Your physician wants you to follow-up in: 6 months. You will receive a reminder letter in the mail two months in advance. If you don't receive a letter, please call our office to schedule the follow-up appointment.

## 2014-04-29 ENCOUNTER — Ambulatory Visit (HOSPITAL_COMMUNITY): Payer: Medicare Other

## 2014-05-02 ENCOUNTER — Telehealth: Payer: Self-pay | Admitting: Cardiology

## 2014-05-02 NOTE — Telephone Encounter (Signed)
Pt called in stating that she was for a stress test on 11/17 and was unable to complete it. She would like to know that the next steps are. Please call  Thanks

## 2014-05-02 NOTE — Telephone Encounter (Signed)
Pt. Was unable to complete her stress echo due to her breast implants, and wanted to know what we planned on doing next, what were the next steps in her care

## 2014-05-06 ENCOUNTER — Other Ambulatory Visit: Payer: Self-pay | Admitting: *Deleted

## 2014-05-06 DIAGNOSIS — R002 Palpitations: Secondary | ICD-10-CM

## 2014-05-06 NOTE — Telephone Encounter (Signed)
Please call and tell her I am sorry the the echo did not work out but that I really want her to have a study so she will need Lexiscan Myoview.

## 2014-05-06 NOTE — Telephone Encounter (Signed)
Order placed for a lexiscan and pt. called

## 2014-05-20 ENCOUNTER — Telehealth (HOSPITAL_COMMUNITY): Payer: Self-pay

## 2014-05-20 NOTE — Telephone Encounter (Signed)
Encounter complete. 

## 2014-05-22 ENCOUNTER — Ambulatory Visit (HOSPITAL_COMMUNITY)
Admission: RE | Admit: 2014-05-22 | Discharge: 2014-05-22 | Disposition: A | Discharge status: Home / Self Care | Payer: Medicare Other | Source: Ambulatory Visit | Attending: Cardiovascular Disease | Admitting: Cardiovascular Disease

## 2014-05-22 DIAGNOSIS — R0600 Dyspnea, unspecified: Secondary | ICD-10-CM | POA: Insufficient documentation

## 2014-05-22 DIAGNOSIS — R9431 Abnormal electrocardiogram [ECG] [EKG]: Secondary | ICD-10-CM

## 2014-05-22 DIAGNOSIS — R002 Palpitations: Secondary | ICD-10-CM | POA: Diagnosis not present

## 2014-05-22 DIAGNOSIS — R079 Chest pain, unspecified: Secondary | ICD-10-CM | POA: Diagnosis not present

## 2014-05-22 MED ORDER — REGADENOSON 0.4 MG/5ML IV SOLN
0.4000 mg | Freq: Once | INTRAVENOUS | Status: AC
  Administered 2014-05-22: 0.4 mg via INTRAVENOUS

## 2014-05-22 MED ORDER — TECHNETIUM TC 99M SESTAMIBI GENERIC - CARDIOLITE
31.0000 | Freq: Once | INTRAVENOUS | Status: AC | PRN
  Administered 2014-05-22: 31 via INTRAVENOUS

## 2014-05-22 MED ORDER — TECHNETIUM TC 99M SESTAMIBI GENERIC - CARDIOLITE
10.9000 | Freq: Once | INTRAVENOUS | Status: AC | PRN
  Administered 2014-05-22: 10.9 via INTRAVENOUS

## 2014-05-22 MED ORDER — AMINOPHYLLINE 25 MG/ML IV SOLN
75.0000 mg | Freq: Once | INTRAVENOUS | Status: AC
  Administered 2014-05-22: 75 mg via INTRAVENOUS

## 2014-05-22 NOTE — Procedures (Addendum)
[name] [address] [place] [address] [phone]  Cardiology Nuclear Med Study  Andrea Villa is a 63 y.o. female     MRN : [id number]     DOB: 05/23/1951  Procedure Date: 05/22/2014  Nuclear Med Background Indication for Stress Test:  Abnormal EKG History:  No prior cardiac or respiratory history reported;No prior NUC MPI for comparison. Cardiac Risk Factors: Family History - CAD and Lipids  Symptoms:  Chest Pain, DOE and Palpitations   Nuclear Pre-Procedure Caffeine/Decaff Intake:  12:00am NPO After: 10am   IV Site: R Forearm  IV 0.9% NS with Angio Cath:  22g  Chest Size (in):  n/a IV Started by: Rolene Course, RN  Height: 4\' 11"  (1.499 m)  Cup Size: C;Pt is s/p Left lumpectomy and lymphectomy.  BMI:  Body mass index is 28.46 kg/(m^2). Weight:  141 lb (63.957 kg)   Tech Comments:  n/a    Nuclear Med Study 1 or 2 day study: 1 day  Stress Test Type:  [place] Provider:  Minus Breeding, MD   Resting Radionuclide: Technetium 50m Sestamibi  Resting Radionuclide Dose: 10.9 mCi   Stress Radionuclide:  Technetium 85m Sestamibi  Stress Radionuclide Dose: 31.0 mCi           Stress Protocol Rest HR: 58 Stress HR: 87  Rest BP: 115/76 Stress BP: 135/69  Exercise Time (min): n/a METS: n/a          Dose of Adenosine (mg):  n/a Dose of Lexiscan: 0.4 mg  Dose of Atropine (mg): n/a Dose of Dobutamine: n/a mcg/kg/min (at max HR)  Stress Test Technologist: Mellody Memos, CCT Nuclear Technologist: Imagene Riches, CNMT   Rest Procedure:  Myocardial perfusion imaging was performed at rest 45 minutes following the intravenous administration of Technetium 69m Sestamibi. Stress Procedure:  The patient received IV Lexiscan 0.4 mg over 15-seconds.  Technetium 7m Sestamibi injected IV at 30-seconds.  Patient experienced dizziness and was administered 75 mg of Aminophylline IV.  There were no significant  changes with Lexiscan.  Quantitative spect images were obtained after a 45 minute delay.  Transient Ischemic Dilatation (Normal <1.22):  1.33  QGS EDV:  63 ml QGS ESV:  21 ml LV Ejection Fraction: 67%  Rest ECG: NSR - Normal EKG  Stress ECG: No significant change from baseline ECG  QPS Raw Data Images:  Normal; no motion artifact; normal heart/lung ratio. Stress Images:  Normal homogeneous uptake in all areas of the myocardium. Rest Images:  Normal homogeneous uptake in all areas of the myocardium. Subtraction (SDS):  No evidence of ischemia.  Impression Exercise Capacity:  Lexiscan with no exercise. BP Response:  Normal blood pressure response. Clinical Symptoms:  No significant symptoms noted. ECG Impression:  No significant ECG changes with Lexiscan. Comparison with Prior Nuclear Study: No previous nuclear study performed  Overall Impression:  Normal stress nuclear study.  LV Wall Motion:  Incoordinate septal motion. LVEF 57%  Pixie Casino, MD, [facility] Board Certified in Nuclear Cardiology Attending Cardiologist [place], MD  05/22/2014 4:25 PM

## 2014-09-26 DIAGNOSIS — H4011X3 Primary open-angle glaucoma, severe stage: Secondary | ICD-10-CM | POA: Diagnosis not present

## 2014-09-26 DIAGNOSIS — H2513 Age-related nuclear cataract, bilateral: Secondary | ICD-10-CM | POA: Diagnosis not present

## 2014-09-26 DIAGNOSIS — H527 Unspecified disorder of refraction: Secondary | ICD-10-CM | POA: Diagnosis not present

## 2014-09-26 DIAGNOSIS — Z79899 Other long term (current) drug therapy: Secondary | ICD-10-CM | POA: Diagnosis not present

## 2014-09-26 DIAGNOSIS — H269 Unspecified cataract: Secondary | ICD-10-CM | POA: Diagnosis not present

## 2014-10-06 DIAGNOSIS — Z853 Personal history of malignant neoplasm of breast: Secondary | ICD-10-CM | POA: Diagnosis not present

## 2014-10-06 DIAGNOSIS — F419 Anxiety disorder, unspecified: Secondary | ICD-10-CM | POA: Diagnosis not present

## 2014-10-06 DIAGNOSIS — Z6827 Body mass index (BMI) 27.0-27.9, adult: Secondary | ICD-10-CM | POA: Diagnosis not present

## 2014-10-06 DIAGNOSIS — Z713 Dietary counseling and surveillance: Secondary | ICD-10-CM | POA: Diagnosis not present

## 2014-10-06 DIAGNOSIS — H409 Unspecified glaucoma: Secondary | ICD-10-CM | POA: Diagnosis not present

## 2014-10-06 DIAGNOSIS — F329 Major depressive disorder, single episode, unspecified: Secondary | ICD-10-CM | POA: Diagnosis not present

## 2014-10-06 DIAGNOSIS — M8580 Other specified disorders of bone density and structure, unspecified site: Secondary | ICD-10-CM | POA: Diagnosis not present

## 2014-10-06 DIAGNOSIS — L709 Acne, unspecified: Secondary | ICD-10-CM | POA: Diagnosis not present

## 2014-10-06 DIAGNOSIS — E782 Mixed hyperlipidemia: Secondary | ICD-10-CM | POA: Diagnosis not present

## 2015-04-03 DIAGNOSIS — E663 Overweight: Secondary | ICD-10-CM | POA: Diagnosis not present

## 2015-04-03 DIAGNOSIS — E559 Vitamin D deficiency, unspecified: Secondary | ICD-10-CM | POA: Diagnosis not present

## 2015-04-03 DIAGNOSIS — F419 Anxiety disorder, unspecified: Secondary | ICD-10-CM | POA: Diagnosis not present

## 2015-04-03 DIAGNOSIS — Z853 Personal history of malignant neoplasm of breast: Secondary | ICD-10-CM | POA: Diagnosis not present

## 2015-04-03 DIAGNOSIS — Z23 Encounter for immunization: Secondary | ICD-10-CM | POA: Diagnosis not present

## 2015-04-03 DIAGNOSIS — Z0000 Encounter for general adult medical examination without abnormal findings: Secondary | ICD-10-CM | POA: Diagnosis not present

## 2015-04-03 DIAGNOSIS — Z79899 Other long term (current) drug therapy: Secondary | ICD-10-CM | POA: Diagnosis not present

## 2015-04-03 DIAGNOSIS — H409 Unspecified glaucoma: Secondary | ICD-10-CM | POA: Diagnosis not present

## 2015-04-03 DIAGNOSIS — R5383 Other fatigue: Secondary | ICD-10-CM | POA: Diagnosis not present

## 2015-04-03 DIAGNOSIS — E782 Mixed hyperlipidemia: Secondary | ICD-10-CM | POA: Diagnosis not present

## 2015-04-03 DIAGNOSIS — M8580 Other specified disorders of bone density and structure, unspecified site: Secondary | ICD-10-CM | POA: Diagnosis not present

## 2015-04-29 DIAGNOSIS — Z853 Personal history of malignant neoplasm of breast: Secondary | ICD-10-CM | POA: Diagnosis not present

## 2015-10-02 DIAGNOSIS — F419 Anxiety disorder, unspecified: Secondary | ICD-10-CM | POA: Diagnosis not present

## 2015-10-02 DIAGNOSIS — L709 Acne, unspecified: Secondary | ICD-10-CM | POA: Diagnosis not present

## 2015-10-02 DIAGNOSIS — F329 Major depressive disorder, single episode, unspecified: Secondary | ICD-10-CM | POA: Diagnosis not present

## 2015-11-02 DIAGNOSIS — F329 Major depressive disorder, single episode, unspecified: Secondary | ICD-10-CM | POA: Diagnosis not present

## 2015-11-13 DIAGNOSIS — H401133 Primary open-angle glaucoma, bilateral, severe stage: Secondary | ICD-10-CM | POA: Diagnosis not present

## 2015-11-13 DIAGNOSIS — H2513 Age-related nuclear cataract, bilateral: Secondary | ICD-10-CM | POA: Diagnosis not present

## 2015-12-23 DIAGNOSIS — H2513 Age-related nuclear cataract, bilateral: Secondary | ICD-10-CM | POA: Diagnosis not present

## 2015-12-23 DIAGNOSIS — H401133 Primary open-angle glaucoma, bilateral, severe stage: Secondary | ICD-10-CM | POA: Diagnosis not present

## 2016-02-16 DIAGNOSIS — R0981 Nasal congestion: Secondary | ICD-10-CM | POA: Diagnosis not present

## 2016-02-16 DIAGNOSIS — R110 Nausea: Secondary | ICD-10-CM | POA: Diagnosis not present

## 2016-03-14 DIAGNOSIS — Z23 Encounter for immunization: Secondary | ICD-10-CM | POA: Diagnosis not present

## 2016-04-08 DIAGNOSIS — G43009 Migraine without aura, not intractable, without status migrainosus: Secondary | ICD-10-CM | POA: Diagnosis not present

## 2016-04-08 DIAGNOSIS — E782 Mixed hyperlipidemia: Secondary | ICD-10-CM | POA: Diagnosis not present

## 2016-04-08 DIAGNOSIS — Z853 Personal history of malignant neoplasm of breast: Secondary | ICD-10-CM | POA: Diagnosis not present

## 2016-04-08 DIAGNOSIS — F419 Anxiety disorder, unspecified: Secondary | ICD-10-CM | POA: Diagnosis not present

## 2016-04-08 DIAGNOSIS — F329 Major depressive disorder, single episode, unspecified: Secondary | ICD-10-CM | POA: Diagnosis not present

## 2016-04-08 DIAGNOSIS — E663 Overweight: Secondary | ICD-10-CM | POA: Diagnosis not present

## 2016-04-08 DIAGNOSIS — H409 Unspecified glaucoma: Secondary | ICD-10-CM | POA: Diagnosis not present

## 2016-04-08 DIAGNOSIS — Z0000 Encounter for general adult medical examination without abnormal findings: Secondary | ICD-10-CM | POA: Diagnosis not present

## 2016-04-08 DIAGNOSIS — Z6827 Body mass index (BMI) 27.0-27.9, adult: Secondary | ICD-10-CM | POA: Diagnosis not present

## 2016-04-08 DIAGNOSIS — Z136 Encounter for screening for cardiovascular disorders: Secondary | ICD-10-CM | POA: Diagnosis not present

## 2016-04-08 DIAGNOSIS — M8580 Other specified disorders of bone density and structure, unspecified site: Secondary | ICD-10-CM | POA: Diagnosis not present

## 2016-04-08 DIAGNOSIS — R252 Cramp and spasm: Secondary | ICD-10-CM | POA: Diagnosis not present

## 2016-05-17 DIAGNOSIS — R922 Inconclusive mammogram: Secondary | ICD-10-CM | POA: Diagnosis not present

## 2016-06-08 DIAGNOSIS — J209 Acute bronchitis, unspecified: Secondary | ICD-10-CM | POA: Diagnosis not present

## 2016-07-29 DIAGNOSIS — H01006 Unspecified blepharitis left eye, unspecified eyelid: Secondary | ICD-10-CM | POA: Diagnosis not present

## 2016-07-29 DIAGNOSIS — H401133 Primary open-angle glaucoma, bilateral, severe stage: Secondary | ICD-10-CM | POA: Diagnosis not present

## 2016-07-29 DIAGNOSIS — H01003 Unspecified blepharitis right eye, unspecified eyelid: Secondary | ICD-10-CM | POA: Diagnosis not present

## 2016-07-29 DIAGNOSIS — H2513 Age-related nuclear cataract, bilateral: Secondary | ICD-10-CM | POA: Diagnosis not present

## 2016-12-26 DIAGNOSIS — Z0100 Encounter for examination of eyes and vision without abnormal findings: Secondary | ICD-10-CM | POA: Diagnosis not present

## 2016-12-28 DIAGNOSIS — R3915 Urgency of urination: Secondary | ICD-10-CM | POA: Diagnosis not present

## 2017-01-02 DIAGNOSIS — Z7951 Long term (current) use of inhaled steroids: Secondary | ICD-10-CM | POA: Diagnosis not present

## 2017-01-02 DIAGNOSIS — R69 Illness, unspecified: Secondary | ICD-10-CM | POA: Diagnosis not present

## 2017-01-02 DIAGNOSIS — E785 Hyperlipidemia, unspecified: Secondary | ICD-10-CM | POA: Diagnosis not present

## 2017-01-02 DIAGNOSIS — Z0000 Encounter for general adult medical examination without abnormal findings: Secondary | ICD-10-CM | POA: Diagnosis not present

## 2017-01-02 DIAGNOSIS — M545 Low back pain: Secondary | ICD-10-CM | POA: Diagnosis not present

## 2017-01-02 DIAGNOSIS — J309 Allergic rhinitis, unspecified: Secondary | ICD-10-CM | POA: Diagnosis not present

## 2017-01-02 DIAGNOSIS — Z6828 Body mass index (BMI) 28.0-28.9, adult: Secondary | ICD-10-CM | POA: Diagnosis not present

## 2017-01-02 DIAGNOSIS — Z9181 History of falling: Secondary | ICD-10-CM | POA: Diagnosis not present

## 2017-01-02 DIAGNOSIS — H9110 Presbycusis, unspecified ear: Secondary | ICD-10-CM | POA: Diagnosis not present

## 2017-01-11 DIAGNOSIS — R8290 Unspecified abnormal findings in urine: Secondary | ICD-10-CM | POA: Diagnosis not present

## 2017-01-17 DIAGNOSIS — M79671 Pain in right foot: Secondary | ICD-10-CM | POA: Diagnosis not present

## 2017-02-28 DIAGNOSIS — Z23 Encounter for immunization: Secondary | ICD-10-CM | POA: Diagnosis not present

## 2017-03-07 DIAGNOSIS — H524 Presbyopia: Secondary | ICD-10-CM | POA: Diagnosis not present

## 2017-03-07 DIAGNOSIS — H2513 Age-related nuclear cataract, bilateral: Secondary | ICD-10-CM | POA: Diagnosis not present

## 2017-03-07 DIAGNOSIS — H401133 Primary open-angle glaucoma, bilateral, severe stage: Secondary | ICD-10-CM | POA: Diagnosis not present

## 2017-03-07 DIAGNOSIS — H53453 Other localized visual field defect, bilateral: Secondary | ICD-10-CM | POA: Diagnosis not present

## 2017-03-07 DIAGNOSIS — H5203 Hypermetropia, bilateral: Secondary | ICD-10-CM | POA: Diagnosis not present

## 2017-04-12 DIAGNOSIS — R8290 Unspecified abnormal findings in urine: Secondary | ICD-10-CM | POA: Diagnosis not present

## 2017-04-12 DIAGNOSIS — E782 Mixed hyperlipidemia: Secondary | ICD-10-CM | POA: Diagnosis not present

## 2017-04-12 DIAGNOSIS — Z853 Personal history of malignant neoplasm of breast: Secondary | ICD-10-CM | POA: Diagnosis not present

## 2017-04-12 DIAGNOSIS — R3915 Urgency of urination: Secondary | ICD-10-CM | POA: Diagnosis not present

## 2017-04-17 DIAGNOSIS — Z853 Personal history of malignant neoplasm of breast: Secondary | ICD-10-CM | POA: Diagnosis not present

## 2017-04-17 DIAGNOSIS — Z1389 Encounter for screening for other disorder: Secondary | ICD-10-CM | POA: Diagnosis not present

## 2017-04-17 DIAGNOSIS — E782 Mixed hyperlipidemia: Secondary | ICD-10-CM | POA: Diagnosis not present

## 2017-04-17 DIAGNOSIS — H409 Unspecified glaucoma: Secondary | ICD-10-CM | POA: Diagnosis not present

## 2017-04-17 DIAGNOSIS — R69 Illness, unspecified: Secondary | ICD-10-CM | POA: Diagnosis not present

## 2017-04-17 DIAGNOSIS — L709 Acne, unspecified: Secondary | ICD-10-CM | POA: Diagnosis not present

## 2017-04-17 DIAGNOSIS — E663 Overweight: Secondary | ICD-10-CM | POA: Diagnosis not present

## 2017-04-17 DIAGNOSIS — G43009 Migraine without aura, not intractable, without status migrainosus: Secondary | ICD-10-CM | POA: Diagnosis not present

## 2017-04-17 DIAGNOSIS — Z0000 Encounter for general adult medical examination without abnormal findings: Secondary | ICD-10-CM | POA: Diagnosis not present

## 2017-04-26 ENCOUNTER — Encounter (HOSPITAL_COMMUNITY): Payer: Self-pay

## 2017-05-03 ENCOUNTER — Encounter (HOSPITAL_COMMUNITY): Payer: Self-pay

## 2017-06-30 DIAGNOSIS — Z853 Personal history of malignant neoplasm of breast: Secondary | ICD-10-CM | POA: Diagnosis not present

## 2017-06-30 DIAGNOSIS — Z1231 Encounter for screening mammogram for malignant neoplasm of breast: Secondary | ICD-10-CM | POA: Diagnosis not present

## 2017-10-06 DIAGNOSIS — H524 Presbyopia: Secondary | ICD-10-CM | POA: Diagnosis not present

## 2017-10-06 DIAGNOSIS — H5203 Hypermetropia, bilateral: Secondary | ICD-10-CM | POA: Diagnosis not present

## 2017-10-06 DIAGNOSIS — H02886 Meibomian gland dysfunction of left eye, unspecified eyelid: Secondary | ICD-10-CM | POA: Diagnosis not present

## 2017-10-06 DIAGNOSIS — H2513 Age-related nuclear cataract, bilateral: Secondary | ICD-10-CM | POA: Diagnosis not present

## 2017-10-06 DIAGNOSIS — H02883 Meibomian gland dysfunction of right eye, unspecified eyelid: Secondary | ICD-10-CM | POA: Diagnosis not present

## 2017-10-06 DIAGNOSIS — H401133 Primary open-angle glaucoma, bilateral, severe stage: Secondary | ICD-10-CM | POA: Diagnosis not present

## 2017-11-24 DIAGNOSIS — R69 Illness, unspecified: Secondary | ICD-10-CM | POA: Diagnosis not present

## 2017-12-07 DIAGNOSIS — R69 Illness, unspecified: Secondary | ICD-10-CM | POA: Diagnosis not present

## 2018-01-22 DIAGNOSIS — W19XXXA Unspecified fall, initial encounter: Secondary | ICD-10-CM | POA: Diagnosis not present

## 2018-01-22 DIAGNOSIS — J029 Acute pharyngitis, unspecified: Secondary | ICD-10-CM | POA: Diagnosis not present

## 2018-01-22 DIAGNOSIS — S0083XA Contusion of other part of head, initial encounter: Secondary | ICD-10-CM | POA: Diagnosis not present

## 2018-02-16 DIAGNOSIS — M545 Low back pain: Secondary | ICD-10-CM | POA: Diagnosis not present

## 2018-04-06 DIAGNOSIS — R69 Illness, unspecified: Secondary | ICD-10-CM | POA: Diagnosis not present

## 2018-04-23 DIAGNOSIS — E559 Vitamin D deficiency, unspecified: Secondary | ICD-10-CM | POA: Diagnosis not present

## 2018-04-23 DIAGNOSIS — Z0000 Encounter for general adult medical examination without abnormal findings: Secondary | ICD-10-CM | POA: Diagnosis not present

## 2018-04-23 DIAGNOSIS — E782 Mixed hyperlipidemia: Secondary | ICD-10-CM | POA: Diagnosis not present

## 2018-04-27 DIAGNOSIS — H409 Unspecified glaucoma: Secondary | ICD-10-CM | POA: Diagnosis not present

## 2018-04-27 DIAGNOSIS — E782 Mixed hyperlipidemia: Secondary | ICD-10-CM | POA: Diagnosis not present

## 2018-04-27 DIAGNOSIS — R69 Illness, unspecified: Secondary | ICD-10-CM | POA: Diagnosis not present

## 2018-04-27 DIAGNOSIS — Z853 Personal history of malignant neoplasm of breast: Secondary | ICD-10-CM | POA: Diagnosis not present

## 2018-04-27 DIAGNOSIS — Z1211 Encounter for screening for malignant neoplasm of colon: Secondary | ICD-10-CM | POA: Diagnosis not present

## 2018-04-27 DIAGNOSIS — Z1389 Encounter for screening for other disorder: Secondary | ICD-10-CM | POA: Diagnosis not present

## 2018-04-27 DIAGNOSIS — E559 Vitamin D deficiency, unspecified: Secondary | ICD-10-CM | POA: Diagnosis not present

## 2018-04-27 DIAGNOSIS — L709 Acne, unspecified: Secondary | ICD-10-CM | POA: Diagnosis not present

## 2018-04-27 DIAGNOSIS — G43009 Migraine without aura, not intractable, without status migrainosus: Secondary | ICD-10-CM | POA: Diagnosis not present

## 2018-04-27 DIAGNOSIS — Z0000 Encounter for general adult medical examination without abnormal findings: Secondary | ICD-10-CM | POA: Diagnosis not present

## 2018-07-03 DIAGNOSIS — S76301A Unspecified injury of muscle, fascia and tendon of the posterior muscle group at thigh level, right thigh, initial encounter: Secondary | ICD-10-CM | POA: Diagnosis not present

## 2018-07-06 DIAGNOSIS — H401133 Primary open-angle glaucoma, bilateral, severe stage: Secondary | ICD-10-CM | POA: Diagnosis not present

## 2018-07-06 DIAGNOSIS — H524 Presbyopia: Secondary | ICD-10-CM | POA: Diagnosis not present

## 2018-07-06 DIAGNOSIS — H2513 Age-related nuclear cataract, bilateral: Secondary | ICD-10-CM | POA: Diagnosis not present

## 2018-07-06 DIAGNOSIS — H5203 Hypermetropia, bilateral: Secondary | ICD-10-CM | POA: Diagnosis not present

## 2018-07-10 DIAGNOSIS — M25561 Pain in right knee: Secondary | ICD-10-CM | POA: Diagnosis not present

## 2018-07-10 DIAGNOSIS — M6281 Muscle weakness (generalized): Secondary | ICD-10-CM | POA: Diagnosis not present

## 2018-07-10 DIAGNOSIS — M25461 Effusion, right knee: Secondary | ICD-10-CM | POA: Diagnosis not present

## 2018-07-10 DIAGNOSIS — M799 Soft tissue disorder, unspecified: Secondary | ICD-10-CM | POA: Diagnosis not present

## 2018-07-11 DIAGNOSIS — M6281 Muscle weakness (generalized): Secondary | ICD-10-CM | POA: Diagnosis not present

## 2018-07-11 DIAGNOSIS — M25561 Pain in right knee: Secondary | ICD-10-CM | POA: Diagnosis not present

## 2018-07-11 DIAGNOSIS — M799 Soft tissue disorder, unspecified: Secondary | ICD-10-CM | POA: Diagnosis not present

## 2018-07-11 DIAGNOSIS — M25461 Effusion, right knee: Secondary | ICD-10-CM | POA: Diagnosis not present

## 2018-07-13 DIAGNOSIS — M25561 Pain in right knee: Secondary | ICD-10-CM | POA: Diagnosis not present

## 2018-07-13 DIAGNOSIS — M25461 Effusion, right knee: Secondary | ICD-10-CM | POA: Diagnosis not present

## 2018-07-13 DIAGNOSIS — M6281 Muscle weakness (generalized): Secondary | ICD-10-CM | POA: Diagnosis not present

## 2018-07-13 DIAGNOSIS — M799 Soft tissue disorder, unspecified: Secondary | ICD-10-CM | POA: Diagnosis not present

## 2018-07-16 DIAGNOSIS — M25561 Pain in right knee: Secondary | ICD-10-CM | POA: Diagnosis not present

## 2018-07-16 DIAGNOSIS — M6281 Muscle weakness (generalized): Secondary | ICD-10-CM | POA: Diagnosis not present

## 2018-07-16 DIAGNOSIS — M799 Soft tissue disorder, unspecified: Secondary | ICD-10-CM | POA: Diagnosis not present

## 2018-07-16 DIAGNOSIS — M25461 Effusion, right knee: Secondary | ICD-10-CM | POA: Diagnosis not present

## 2018-07-19 DIAGNOSIS — M25461 Effusion, right knee: Secondary | ICD-10-CM | POA: Diagnosis not present

## 2018-07-19 DIAGNOSIS — M6281 Muscle weakness (generalized): Secondary | ICD-10-CM | POA: Diagnosis not present

## 2018-07-19 DIAGNOSIS — M25561 Pain in right knee: Secondary | ICD-10-CM | POA: Diagnosis not present

## 2018-07-19 DIAGNOSIS — M799 Soft tissue disorder, unspecified: Secondary | ICD-10-CM | POA: Diagnosis not present

## 2018-07-20 DIAGNOSIS — M6281 Muscle weakness (generalized): Secondary | ICD-10-CM | POA: Diagnosis not present

## 2018-07-20 DIAGNOSIS — Z853 Personal history of malignant neoplasm of breast: Secondary | ICD-10-CM | POA: Diagnosis not present

## 2018-07-20 DIAGNOSIS — M25461 Effusion, right knee: Secondary | ICD-10-CM | POA: Diagnosis not present

## 2018-07-20 DIAGNOSIS — M799 Soft tissue disorder, unspecified: Secondary | ICD-10-CM | POA: Diagnosis not present

## 2018-07-20 DIAGNOSIS — M8589 Other specified disorders of bone density and structure, multiple sites: Secondary | ICD-10-CM | POA: Diagnosis not present

## 2018-07-20 DIAGNOSIS — R296 Repeated falls: Secondary | ICD-10-CM | POA: Diagnosis not present

## 2018-07-20 DIAGNOSIS — Z1231 Encounter for screening mammogram for malignant neoplasm of breast: Secondary | ICD-10-CM | POA: Diagnosis not present

## 2018-07-20 DIAGNOSIS — M25561 Pain in right knee: Secondary | ICD-10-CM | POA: Diagnosis not present

## 2018-07-24 DIAGNOSIS — M25561 Pain in right knee: Secondary | ICD-10-CM | POA: Diagnosis not present

## 2018-07-24 DIAGNOSIS — M6281 Muscle weakness (generalized): Secondary | ICD-10-CM | POA: Diagnosis not present

## 2018-07-24 DIAGNOSIS — M25461 Effusion, right knee: Secondary | ICD-10-CM | POA: Diagnosis not present

## 2018-07-24 DIAGNOSIS — M799 Soft tissue disorder, unspecified: Secondary | ICD-10-CM | POA: Diagnosis not present

## 2018-07-26 DIAGNOSIS — M25461 Effusion, right knee: Secondary | ICD-10-CM | POA: Diagnosis not present

## 2018-07-26 DIAGNOSIS — M799 Soft tissue disorder, unspecified: Secondary | ICD-10-CM | POA: Diagnosis not present

## 2018-07-26 DIAGNOSIS — M6281 Muscle weakness (generalized): Secondary | ICD-10-CM | POA: Diagnosis not present

## 2018-07-26 DIAGNOSIS — M25561 Pain in right knee: Secondary | ICD-10-CM | POA: Diagnosis not present

## 2018-07-30 DIAGNOSIS — E559 Vitamin D deficiency, unspecified: Secondary | ICD-10-CM | POA: Diagnosis not present

## 2018-07-31 DIAGNOSIS — M6281 Muscle weakness (generalized): Secondary | ICD-10-CM | POA: Diagnosis not present

## 2018-07-31 DIAGNOSIS — M25561 Pain in right knee: Secondary | ICD-10-CM | POA: Diagnosis not present

## 2018-07-31 DIAGNOSIS — M25461 Effusion, right knee: Secondary | ICD-10-CM | POA: Diagnosis not present

## 2018-07-31 DIAGNOSIS — M799 Soft tissue disorder, unspecified: Secondary | ICD-10-CM | POA: Diagnosis not present

## 2018-08-02 DIAGNOSIS — M6281 Muscle weakness (generalized): Secondary | ICD-10-CM | POA: Diagnosis not present

## 2018-08-02 DIAGNOSIS — M25561 Pain in right knee: Secondary | ICD-10-CM | POA: Diagnosis not present

## 2018-08-02 DIAGNOSIS — M25461 Effusion, right knee: Secondary | ICD-10-CM | POA: Diagnosis not present

## 2018-08-02 DIAGNOSIS — M799 Soft tissue disorder, unspecified: Secondary | ICD-10-CM | POA: Diagnosis not present

## 2018-08-09 DIAGNOSIS — M799 Soft tissue disorder, unspecified: Secondary | ICD-10-CM | POA: Diagnosis not present

## 2018-08-09 DIAGNOSIS — M6281 Muscle weakness (generalized): Secondary | ICD-10-CM | POA: Diagnosis not present

## 2018-08-09 DIAGNOSIS — M25561 Pain in right knee: Secondary | ICD-10-CM | POA: Diagnosis not present

## 2018-08-09 DIAGNOSIS — M25461 Effusion, right knee: Secondary | ICD-10-CM | POA: Diagnosis not present

## 2018-08-13 DIAGNOSIS — M799 Soft tissue disorder, unspecified: Secondary | ICD-10-CM | POA: Diagnosis not present

## 2018-08-13 DIAGNOSIS — M6281 Muscle weakness (generalized): Secondary | ICD-10-CM | POA: Diagnosis not present

## 2018-08-13 DIAGNOSIS — M25561 Pain in right knee: Secondary | ICD-10-CM | POA: Diagnosis not present

## 2018-08-13 DIAGNOSIS — M25461 Effusion, right knee: Secondary | ICD-10-CM | POA: Diagnosis not present

## 2018-08-14 DIAGNOSIS — M79605 Pain in left leg: Secondary | ICD-10-CM | POA: Diagnosis not present

## 2018-08-14 DIAGNOSIS — M533 Sacrococcygeal disorders, not elsewhere classified: Secondary | ICD-10-CM | POA: Diagnosis not present

## 2018-08-14 DIAGNOSIS — Z6827 Body mass index (BMI) 27.0-27.9, adult: Secondary | ICD-10-CM | POA: Diagnosis not present

## 2018-08-23 DIAGNOSIS — Z0100 Encounter for examination of eyes and vision without abnormal findings: Secondary | ICD-10-CM | POA: Diagnosis not present

## 2018-11-13 DIAGNOSIS — Z853 Personal history of malignant neoplasm of breast: Secondary | ICD-10-CM | POA: Diagnosis not present

## 2018-11-13 DIAGNOSIS — E782 Mixed hyperlipidemia: Secondary | ICD-10-CM | POA: Diagnosis not present

## 2018-11-13 DIAGNOSIS — H409 Unspecified glaucoma: Secondary | ICD-10-CM | POA: Diagnosis not present

## 2018-11-13 DIAGNOSIS — M8580 Other specified disorders of bone density and structure, unspecified site: Secondary | ICD-10-CM | POA: Diagnosis not present

## 2018-11-13 DIAGNOSIS — R69 Illness, unspecified: Secondary | ICD-10-CM | POA: Diagnosis not present

## 2018-12-17 DIAGNOSIS — H2513 Age-related nuclear cataract, bilateral: Secondary | ICD-10-CM | POA: Diagnosis not present

## 2018-12-17 DIAGNOSIS — H524 Presbyopia: Secondary | ICD-10-CM | POA: Diagnosis not present

## 2018-12-17 DIAGNOSIS — H401133 Primary open-angle glaucoma, bilateral, severe stage: Secondary | ICD-10-CM | POA: Diagnosis not present

## 2018-12-17 DIAGNOSIS — H5203 Hypermetropia, bilateral: Secondary | ICD-10-CM | POA: Diagnosis not present

## 2019-03-05 DIAGNOSIS — Z23 Encounter for immunization: Secondary | ICD-10-CM | POA: Diagnosis not present

## 2019-03-13 DIAGNOSIS — M25571 Pain in right ankle and joints of right foot: Secondary | ICD-10-CM | POA: Diagnosis not present

## 2019-03-28 DIAGNOSIS — M25571 Pain in right ankle and joints of right foot: Secondary | ICD-10-CM | POA: Diagnosis not present

## 2019-04-03 DIAGNOSIS — M25571 Pain in right ankle and joints of right foot: Secondary | ICD-10-CM | POA: Diagnosis not present

## 2019-04-09 DIAGNOSIS — M25571 Pain in right ankle and joints of right foot: Secondary | ICD-10-CM | POA: Diagnosis not present

## 2019-05-13 DIAGNOSIS — M25571 Pain in right ankle and joints of right foot: Secondary | ICD-10-CM | POA: Diagnosis not present

## 2019-05-20 DIAGNOSIS — Z853 Personal history of malignant neoplasm of breast: Secondary | ICD-10-CM | POA: Diagnosis not present

## 2019-05-20 DIAGNOSIS — M8580 Other specified disorders of bone density and structure, unspecified site: Secondary | ICD-10-CM | POA: Diagnosis not present

## 2019-05-20 DIAGNOSIS — H409 Unspecified glaucoma: Secondary | ICD-10-CM | POA: Diagnosis not present

## 2019-05-20 DIAGNOSIS — Z0000 Encounter for general adult medical examination without abnormal findings: Secondary | ICD-10-CM | POA: Diagnosis not present

## 2019-05-20 DIAGNOSIS — E782 Mixed hyperlipidemia: Secondary | ICD-10-CM | POA: Diagnosis not present

## 2019-05-20 DIAGNOSIS — R69 Illness, unspecified: Secondary | ICD-10-CM | POA: Diagnosis not present

## 2019-05-20 DIAGNOSIS — G43009 Migraine without aura, not intractable, without status migrainosus: Secondary | ICD-10-CM | POA: Diagnosis not present

## 2019-07-01 ENCOUNTER — Other Ambulatory Visit: Payer: Medicare Other

## 2019-07-29 DIAGNOSIS — H524 Presbyopia: Secondary | ICD-10-CM | POA: Diagnosis not present

## 2019-07-29 DIAGNOSIS — H401133 Primary open-angle glaucoma, bilateral, severe stage: Secondary | ICD-10-CM | POA: Diagnosis not present

## 2019-07-29 DIAGNOSIS — H5203 Hypermetropia, bilateral: Secondary | ICD-10-CM | POA: Diagnosis not present

## 2019-07-29 DIAGNOSIS — H2513 Age-related nuclear cataract, bilateral: Secondary | ICD-10-CM | POA: Diagnosis not present

## 2019-09-02 DIAGNOSIS — Z1231 Encounter for screening mammogram for malignant neoplasm of breast: Secondary | ICD-10-CM | POA: Diagnosis not present

## 2019-09-17 DIAGNOSIS — R922 Inconclusive mammogram: Secondary | ICD-10-CM | POA: Diagnosis not present

## 2019-10-07 DIAGNOSIS — M8580 Other specified disorders of bone density and structure, unspecified site: Secondary | ICD-10-CM | POA: Diagnosis not present

## 2019-10-07 DIAGNOSIS — E782 Mixed hyperlipidemia: Secondary | ICD-10-CM | POA: Diagnosis not present

## 2019-10-07 DIAGNOSIS — R69 Illness, unspecified: Secondary | ICD-10-CM | POA: Diagnosis not present

## 2019-10-07 DIAGNOSIS — H409 Unspecified glaucoma: Secondary | ICD-10-CM | POA: Diagnosis not present

## 2019-10-07 DIAGNOSIS — G43009 Migraine without aura, not intractable, without status migrainosus: Secondary | ICD-10-CM | POA: Diagnosis not present

## 2019-10-07 DIAGNOSIS — Z853 Personal history of malignant neoplasm of breast: Secondary | ICD-10-CM | POA: Diagnosis not present

## 2019-10-24 DIAGNOSIS — Z0100 Encounter for examination of eyes and vision without abnormal findings: Secondary | ICD-10-CM | POA: Diagnosis not present

## 2019-11-13 DIAGNOSIS — M8580 Other specified disorders of bone density and structure, unspecified site: Secondary | ICD-10-CM | POA: Diagnosis not present

## 2019-11-13 DIAGNOSIS — Z853 Personal history of malignant neoplasm of breast: Secondary | ICD-10-CM | POA: Diagnosis not present

## 2019-11-13 DIAGNOSIS — R69 Illness, unspecified: Secondary | ICD-10-CM | POA: Diagnosis not present

## 2019-11-13 DIAGNOSIS — G43009 Migraine without aura, not intractable, without status migrainosus: Secondary | ICD-10-CM | POA: Diagnosis not present

## 2019-11-13 DIAGNOSIS — E782 Mixed hyperlipidemia: Secondary | ICD-10-CM | POA: Diagnosis not present

## 2019-11-13 DIAGNOSIS — H409 Unspecified glaucoma: Secondary | ICD-10-CM | POA: Diagnosis not present

## 2019-12-30 DIAGNOSIS — E782 Mixed hyperlipidemia: Secondary | ICD-10-CM | POA: Diagnosis not present

## 2019-12-30 DIAGNOSIS — H409 Unspecified glaucoma: Secondary | ICD-10-CM | POA: Diagnosis not present

## 2019-12-30 DIAGNOSIS — M8580 Other specified disorders of bone density and structure, unspecified site: Secondary | ICD-10-CM | POA: Diagnosis not present

## 2019-12-30 DIAGNOSIS — G43009 Migraine without aura, not intractable, without status migrainosus: Secondary | ICD-10-CM | POA: Diagnosis not present

## 2019-12-30 DIAGNOSIS — R69 Illness, unspecified: Secondary | ICD-10-CM | POA: Diagnosis not present

## 2019-12-30 DIAGNOSIS — Z853 Personal history of malignant neoplasm of breast: Secondary | ICD-10-CM | POA: Diagnosis not present

## 2020-01-17 DIAGNOSIS — Z853 Personal history of malignant neoplasm of breast: Secondary | ICD-10-CM | POA: Diagnosis not present

## 2020-01-17 DIAGNOSIS — R69 Illness, unspecified: Secondary | ICD-10-CM | POA: Diagnosis not present

## 2020-01-17 DIAGNOSIS — Z6827 Body mass index (BMI) 27.0-27.9, adult: Secondary | ICD-10-CM | POA: Diagnosis not present

## 2020-01-17 DIAGNOSIS — E782 Mixed hyperlipidemia: Secondary | ICD-10-CM | POA: Diagnosis not present

## 2020-01-17 DIAGNOSIS — E559 Vitamin D deficiency, unspecified: Secondary | ICD-10-CM | POA: Diagnosis not present

## 2020-01-17 DIAGNOSIS — L659 Nonscarring hair loss, unspecified: Secondary | ICD-10-CM | POA: Diagnosis not present

## 2020-01-17 DIAGNOSIS — R5383 Other fatigue: Secondary | ICD-10-CM | POA: Diagnosis not present

## 2020-01-17 DIAGNOSIS — G43009 Migraine without aura, not intractable, without status migrainosus: Secondary | ICD-10-CM | POA: Diagnosis not present

## 2020-01-17 DIAGNOSIS — Z1211 Encounter for screening for malignant neoplasm of colon: Secondary | ICD-10-CM | POA: Diagnosis not present

## 2020-01-20 DIAGNOSIS — Z1211 Encounter for screening for malignant neoplasm of colon: Secondary | ICD-10-CM | POA: Diagnosis not present

## 2020-01-28 DIAGNOSIS — H2513 Age-related nuclear cataract, bilateral: Secondary | ICD-10-CM | POA: Diagnosis not present

## 2020-01-28 DIAGNOSIS — Z973 Presence of spectacles and contact lenses: Secondary | ICD-10-CM | POA: Diagnosis not present

## 2020-01-28 DIAGNOSIS — H5203 Hypermetropia, bilateral: Secondary | ICD-10-CM | POA: Diagnosis not present

## 2020-01-28 DIAGNOSIS — Z83511 Family history of glaucoma: Secondary | ICD-10-CM | POA: Diagnosis not present

## 2020-01-28 DIAGNOSIS — H401133 Primary open-angle glaucoma, bilateral, severe stage: Secondary | ICD-10-CM | POA: Diagnosis not present

## 2020-01-28 DIAGNOSIS — H524 Presbyopia: Secondary | ICD-10-CM | POA: Diagnosis not present

## 2020-02-10 DIAGNOSIS — H524 Presbyopia: Secondary | ICD-10-CM | POA: Diagnosis not present

## 2020-02-10 DIAGNOSIS — H401133 Primary open-angle glaucoma, bilateral, severe stage: Secondary | ICD-10-CM | POA: Diagnosis not present

## 2020-02-10 DIAGNOSIS — H2513 Age-related nuclear cataract, bilateral: Secondary | ICD-10-CM | POA: Diagnosis not present

## 2020-02-10 DIAGNOSIS — H35373 Puckering of macula, bilateral: Secondary | ICD-10-CM | POA: Diagnosis not present

## 2020-02-10 DIAGNOSIS — H5203 Hypermetropia, bilateral: Secondary | ICD-10-CM | POA: Diagnosis not present

## 2020-03-25 DIAGNOSIS — H2511 Age-related nuclear cataract, right eye: Secondary | ICD-10-CM | POA: Diagnosis not present

## 2020-03-25 DIAGNOSIS — H401113 Primary open-angle glaucoma, right eye, severe stage: Secondary | ICD-10-CM | POA: Diagnosis not present

## 2020-03-31 DIAGNOSIS — Z23 Encounter for immunization: Secondary | ICD-10-CM | POA: Diagnosis not present

## 2020-04-09 DIAGNOSIS — G43009 Migraine without aura, not intractable, without status migrainosus: Secondary | ICD-10-CM | POA: Diagnosis not present

## 2020-04-09 DIAGNOSIS — E782 Mixed hyperlipidemia: Secondary | ICD-10-CM | POA: Diagnosis not present

## 2020-04-09 DIAGNOSIS — H409 Unspecified glaucoma: Secondary | ICD-10-CM | POA: Diagnosis not present

## 2020-04-09 DIAGNOSIS — M8580 Other specified disorders of bone density and structure, unspecified site: Secondary | ICD-10-CM | POA: Diagnosis not present

## 2020-04-09 DIAGNOSIS — R69 Illness, unspecified: Secondary | ICD-10-CM | POA: Diagnosis not present

## 2020-04-09 DIAGNOSIS — Z853 Personal history of malignant neoplasm of breast: Secondary | ICD-10-CM | POA: Diagnosis not present

## 2020-04-27 DIAGNOSIS — Z79899 Other long term (current) drug therapy: Secondary | ICD-10-CM | POA: Diagnosis not present

## 2020-04-27 DIAGNOSIS — E782 Mixed hyperlipidemia: Secondary | ICD-10-CM | POA: Diagnosis not present

## 2020-04-27 DIAGNOSIS — G43009 Migraine without aura, not intractable, without status migrainosus: Secondary | ICD-10-CM | POA: Diagnosis not present

## 2020-04-27 DIAGNOSIS — L709 Acne, unspecified: Secondary | ICD-10-CM | POA: Diagnosis not present

## 2020-04-27 DIAGNOSIS — H409 Unspecified glaucoma: Secondary | ICD-10-CM | POA: Diagnosis not present

## 2020-04-27 DIAGNOSIS — M25571 Pain in right ankle and joints of right foot: Secondary | ICD-10-CM | POA: Diagnosis not present

## 2020-04-27 DIAGNOSIS — R5383 Other fatigue: Secondary | ICD-10-CM | POA: Diagnosis not present

## 2020-04-27 DIAGNOSIS — L659 Nonscarring hair loss, unspecified: Secondary | ICD-10-CM | POA: Diagnosis not present

## 2020-04-27 DIAGNOSIS — Z853 Personal history of malignant neoplasm of breast: Secondary | ICD-10-CM | POA: Diagnosis not present

## 2020-04-27 DIAGNOSIS — Z1211 Encounter for screening for malignant neoplasm of colon: Secondary | ICD-10-CM | POA: Diagnosis not present

## 2020-05-04 DIAGNOSIS — H2512 Age-related nuclear cataract, left eye: Secondary | ICD-10-CM | POA: Diagnosis not present

## 2020-05-04 DIAGNOSIS — H401133 Primary open-angle glaucoma, bilateral, severe stage: Secondary | ICD-10-CM | POA: Diagnosis not present

## 2020-05-04 DIAGNOSIS — H35373 Puckering of macula, bilateral: Secondary | ICD-10-CM | POA: Diagnosis not present

## 2020-05-25 ENCOUNTER — Other Ambulatory Visit: Payer: Self-pay | Admitting: Family Medicine

## 2020-05-25 DIAGNOSIS — E7800 Pure hypercholesterolemia, unspecified: Secondary | ICD-10-CM

## 2020-07-10 DIAGNOSIS — E7800 Pure hypercholesterolemia, unspecified: Secondary | ICD-10-CM | POA: Diagnosis not present

## 2020-07-10 DIAGNOSIS — H409 Unspecified glaucoma: Secondary | ICD-10-CM | POA: Diagnosis not present

## 2020-07-10 DIAGNOSIS — R69 Illness, unspecified: Secondary | ICD-10-CM | POA: Diagnosis not present

## 2020-07-10 DIAGNOSIS — M8580 Other specified disorders of bone density and structure, unspecified site: Secondary | ICD-10-CM | POA: Diagnosis not present

## 2020-07-10 DIAGNOSIS — G43009 Migraine without aura, not intractable, without status migrainosus: Secondary | ICD-10-CM | POA: Diagnosis not present

## 2020-07-10 DIAGNOSIS — Z853 Personal history of malignant neoplasm of breast: Secondary | ICD-10-CM | POA: Diagnosis not present

## 2020-07-10 DIAGNOSIS — E782 Mixed hyperlipidemia: Secondary | ICD-10-CM | POA: Diagnosis not present

## 2020-07-27 ENCOUNTER — Ambulatory Visit
Admission: RE | Admit: 2020-07-27 | Discharge: 2020-07-27 | Disposition: A | Discharge status: Home / Self Care | Payer: No Typology Code available for payment source | Source: Ambulatory Visit | Attending: Family Medicine | Admitting: Family Medicine

## 2020-07-27 DIAGNOSIS — E785 Hyperlipidemia, unspecified: Secondary | ICD-10-CM | POA: Diagnosis not present

## 2020-07-27 DIAGNOSIS — E7800 Pure hypercholesterolemia, unspecified: Secondary | ICD-10-CM

## 2020-08-14 DIAGNOSIS — E782 Mixed hyperlipidemia: Secondary | ICD-10-CM | POA: Diagnosis not present

## 2020-08-18 DIAGNOSIS — R69 Illness, unspecified: Secondary | ICD-10-CM | POA: Diagnosis not present

## 2020-08-18 DIAGNOSIS — E559 Vitamin D deficiency, unspecified: Secondary | ICD-10-CM | POA: Diagnosis not present

## 2020-08-18 DIAGNOSIS — G43009 Migraine without aura, not intractable, without status migrainosus: Secondary | ICD-10-CM | POA: Diagnosis not present

## 2020-08-18 DIAGNOSIS — F419 Anxiety disorder, unspecified: Secondary | ICD-10-CM | POA: Diagnosis not present

## 2020-08-18 DIAGNOSIS — Z0000 Encounter for general adult medical examination without abnormal findings: Secondary | ICD-10-CM | POA: Diagnosis not present

## 2020-08-18 DIAGNOSIS — Z6826 Body mass index (BMI) 26.0-26.9, adult: Secondary | ICD-10-CM | POA: Diagnosis not present

## 2020-08-18 DIAGNOSIS — E7800 Pure hypercholesterolemia, unspecified: Secondary | ICD-10-CM | POA: Diagnosis not present

## 2020-08-18 DIAGNOSIS — I700 Atherosclerosis of aorta: Secondary | ICD-10-CM | POA: Diagnosis not present

## 2020-08-18 DIAGNOSIS — Z1389 Encounter for screening for other disorder: Secondary | ICD-10-CM | POA: Diagnosis not present

## 2020-08-18 DIAGNOSIS — E782 Mixed hyperlipidemia: Secondary | ICD-10-CM | POA: Diagnosis not present

## 2020-09-07 DIAGNOSIS — Z1231 Encounter for screening mammogram for malignant neoplasm of breast: Secondary | ICD-10-CM | POA: Diagnosis not present

## 2020-09-07 DIAGNOSIS — Z780 Asymptomatic menopausal state: Secondary | ICD-10-CM | POA: Diagnosis not present

## 2020-09-07 DIAGNOSIS — M8589 Other specified disorders of bone density and structure, multiple sites: Secondary | ICD-10-CM | POA: Diagnosis not present

## 2020-09-10 DIAGNOSIS — G43009 Migraine without aura, not intractable, without status migrainosus: Secondary | ICD-10-CM | POA: Diagnosis not present

## 2020-09-10 DIAGNOSIS — H409 Unspecified glaucoma: Secondary | ICD-10-CM | POA: Diagnosis not present

## 2020-09-10 DIAGNOSIS — Z853 Personal history of malignant neoplasm of breast: Secondary | ICD-10-CM | POA: Diagnosis not present

## 2020-09-10 DIAGNOSIS — R69 Illness, unspecified: Secondary | ICD-10-CM | POA: Diagnosis not present

## 2020-09-10 DIAGNOSIS — M810 Age-related osteoporosis without current pathological fracture: Secondary | ICD-10-CM | POA: Diagnosis not present

## 2020-09-10 DIAGNOSIS — E782 Mixed hyperlipidemia: Secondary | ICD-10-CM | POA: Diagnosis not present

## 2020-10-01 DIAGNOSIS — M25511 Pain in right shoulder: Secondary | ICD-10-CM | POA: Diagnosis not present

## 2020-10-29 DIAGNOSIS — M25511 Pain in right shoulder: Secondary | ICD-10-CM | POA: Diagnosis not present

## 2020-12-24 DIAGNOSIS — M810 Age-related osteoporosis without current pathological fracture: Secondary | ICD-10-CM | POA: Diagnosis not present

## 2020-12-24 DIAGNOSIS — H409 Unspecified glaucoma: Secondary | ICD-10-CM | POA: Diagnosis not present

## 2020-12-24 DIAGNOSIS — R69 Illness, unspecified: Secondary | ICD-10-CM | POA: Diagnosis not present

## 2020-12-24 DIAGNOSIS — E7800 Pure hypercholesterolemia, unspecified: Secondary | ICD-10-CM | POA: Diagnosis not present

## 2020-12-24 DIAGNOSIS — G43009 Migraine without aura, not intractable, without status migrainosus: Secondary | ICD-10-CM | POA: Diagnosis not present

## 2020-12-24 DIAGNOSIS — E782 Mixed hyperlipidemia: Secondary | ICD-10-CM | POA: Diagnosis not present

## 2020-12-24 DIAGNOSIS — Z853 Personal history of malignant neoplasm of breast: Secondary | ICD-10-CM | POA: Diagnosis not present

## 2021-02-03 DIAGNOSIS — H409 Unspecified glaucoma: Secondary | ICD-10-CM | POA: Diagnosis not present

## 2021-02-03 DIAGNOSIS — E782 Mixed hyperlipidemia: Secondary | ICD-10-CM | POA: Diagnosis not present

## 2021-02-03 DIAGNOSIS — R69 Illness, unspecified: Secondary | ICD-10-CM | POA: Diagnosis not present

## 2021-02-03 DIAGNOSIS — E7800 Pure hypercholesterolemia, unspecified: Secondary | ICD-10-CM | POA: Diagnosis not present

## 2021-02-03 DIAGNOSIS — Z853 Personal history of malignant neoplasm of breast: Secondary | ICD-10-CM | POA: Diagnosis not present

## 2021-02-03 DIAGNOSIS — G43009 Migraine without aura, not intractable, without status migrainosus: Secondary | ICD-10-CM | POA: Diagnosis not present

## 2021-02-03 DIAGNOSIS — M810 Age-related osteoporosis without current pathological fracture: Secondary | ICD-10-CM | POA: Diagnosis not present

## 2021-03-12 DIAGNOSIS — G43009 Migraine without aura, not intractable, without status migrainosus: Secondary | ICD-10-CM | POA: Diagnosis not present

## 2021-03-12 DIAGNOSIS — M810 Age-related osteoporosis without current pathological fracture: Secondary | ICD-10-CM | POA: Diagnosis not present

## 2021-03-12 DIAGNOSIS — E7800 Pure hypercholesterolemia, unspecified: Secondary | ICD-10-CM | POA: Diagnosis not present

## 2021-03-12 DIAGNOSIS — R69 Illness, unspecified: Secondary | ICD-10-CM | POA: Diagnosis not present

## 2021-03-12 DIAGNOSIS — E782 Mixed hyperlipidemia: Secondary | ICD-10-CM | POA: Diagnosis not present

## 2021-03-12 DIAGNOSIS — H409 Unspecified glaucoma: Secondary | ICD-10-CM | POA: Diagnosis not present

## 2021-03-17 DIAGNOSIS — Z23 Encounter for immunization: Secondary | ICD-10-CM | POA: Diagnosis not present

## 2021-03-21 IMAGING — CT CT CARDIAC CORONARY ARTERY CALCIUM SCORE
3 series · 14 of 20 positions shown, 16 images · non-contrast
Comparison: None.

CLINICAL DATA: Hyperlipidemia

EXAM:
CT CARDIAC CORONARY ARTERY CALCIUM SCORE
TECHNIQUE: Non-contrast imaging through the heart was performed using
prospective ECG gating. Image post processing was performed on an
independent workstation, allowing for quantitative analysis of the
heart and coronary arteries. Note that this exam targets the heart
and the chest was not imaged in its entirety.

[Series 2: calcium scoring 2.00 qr36 bestdiast 69% hrt calciu · axial · 0.38mm/px · z∈[+1513,+1597]mm · 4 of 70 slices shown]
[im 14/70  vessel]
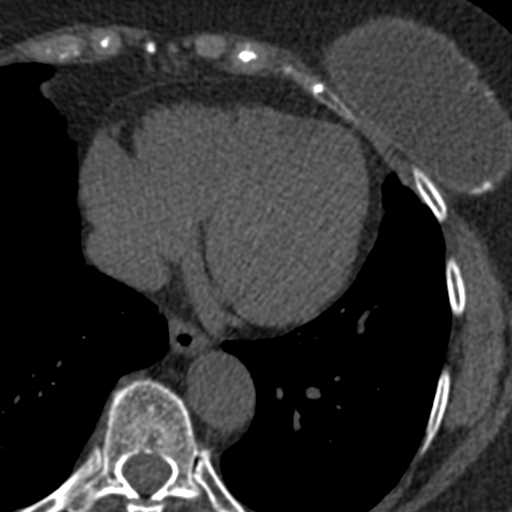
[im 28/70  vessel]
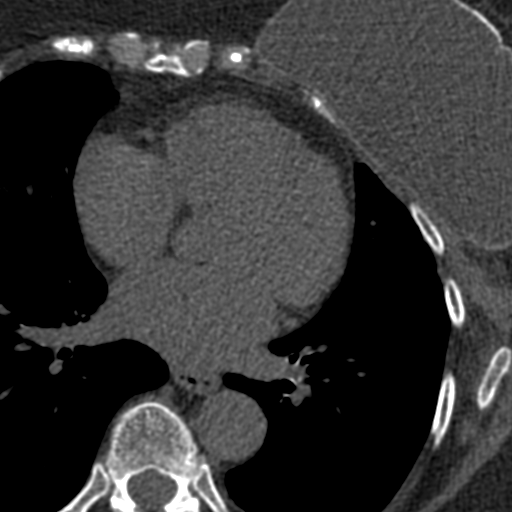
[im 42/70  vessel]
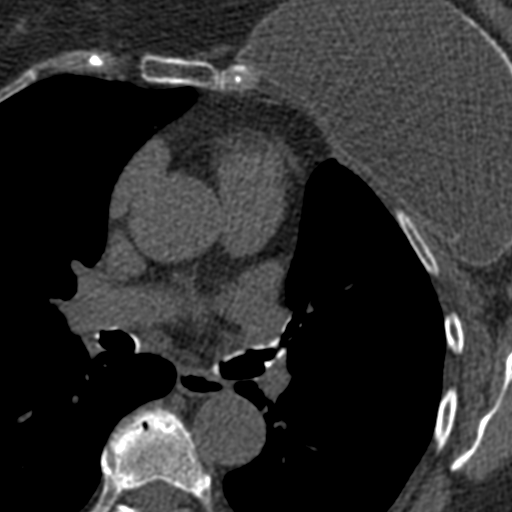
[im 56/70  vessel]
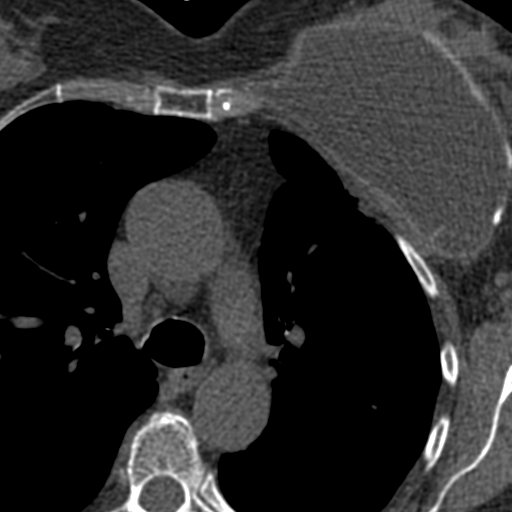

[Series 3: calcium scoring 2.00 br40 bestdiast 69% axial · axial · 0.59mm/px · z∈[+1509,+1601]mm · 5 of 70 slices shown, 7 images]
[im 12/70  vessel]
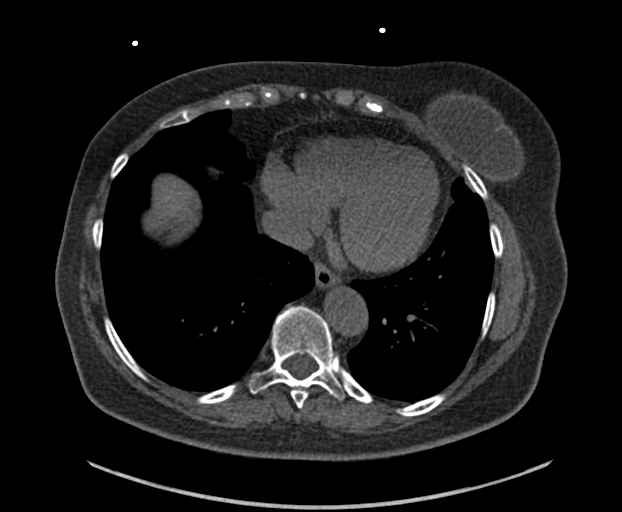
[im 12/70  lung]
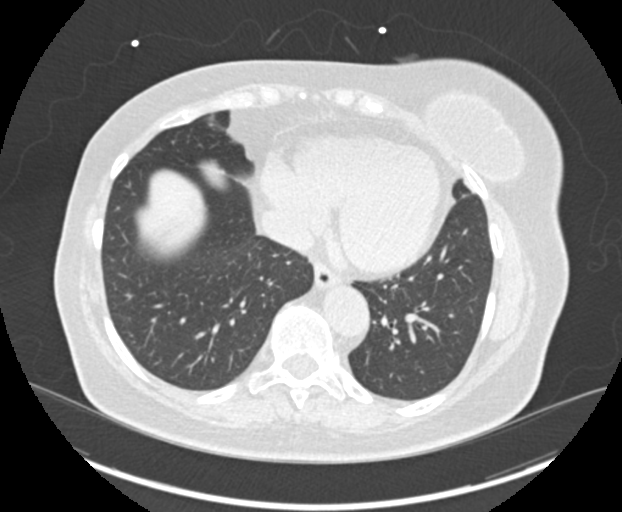
[im 24/70  vessel]
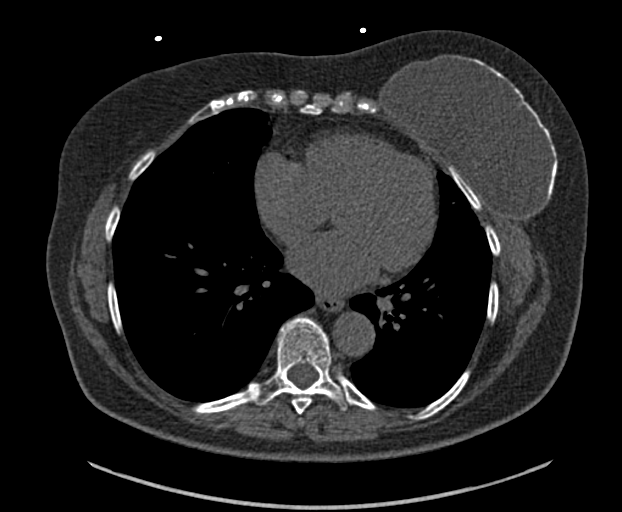
[im 35/70  vessel]
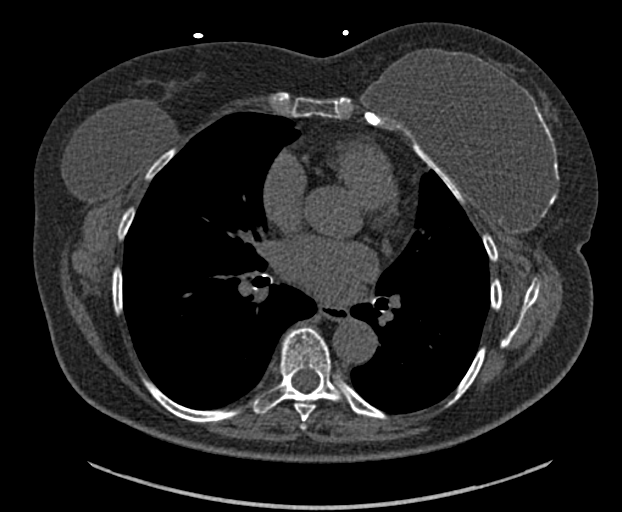
[im 47/70  vessel]
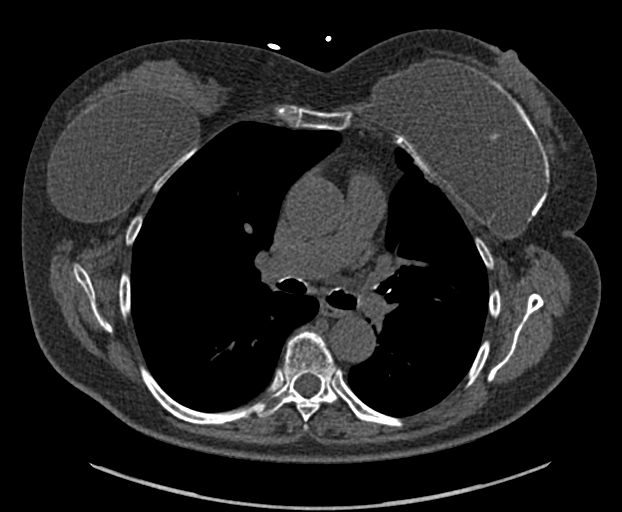
[im 58/70  vessel]
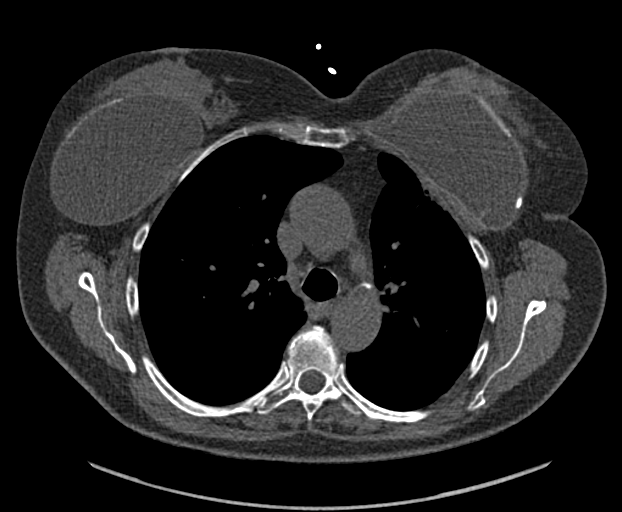
[im 58/70  lung]
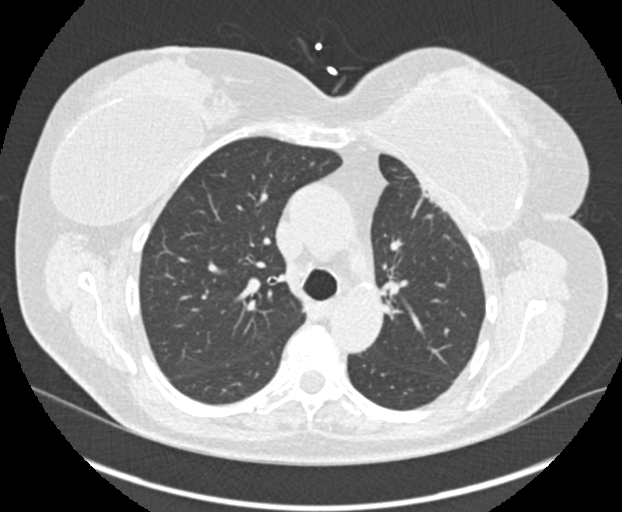

[Series 9: calcium scoring 2.00 br60 bestdiast 69% lungs · axial · 0.59mm/px · z∈[+1509,+1601]mm · 5 of 70 slices shown]
[im 12/70  vessel]
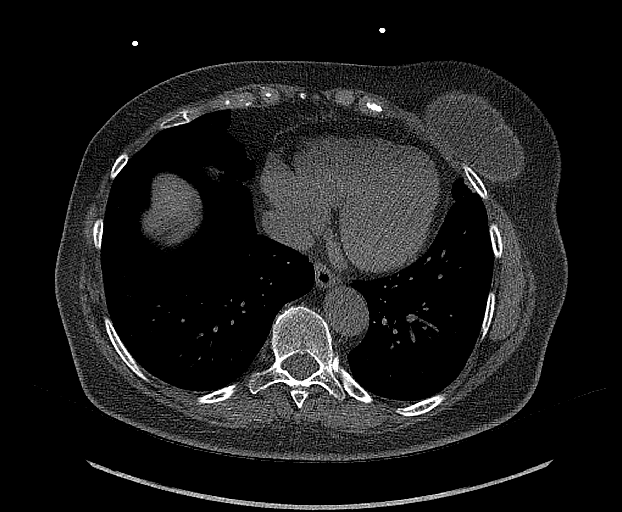
[im 24/70  vessel]
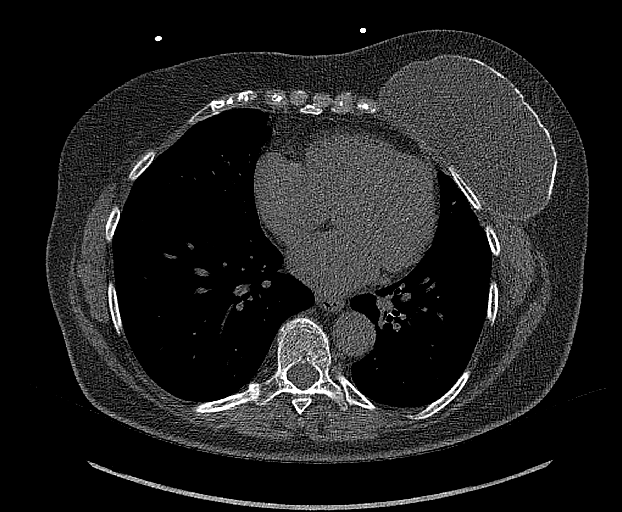
[im 35/70  vessel]
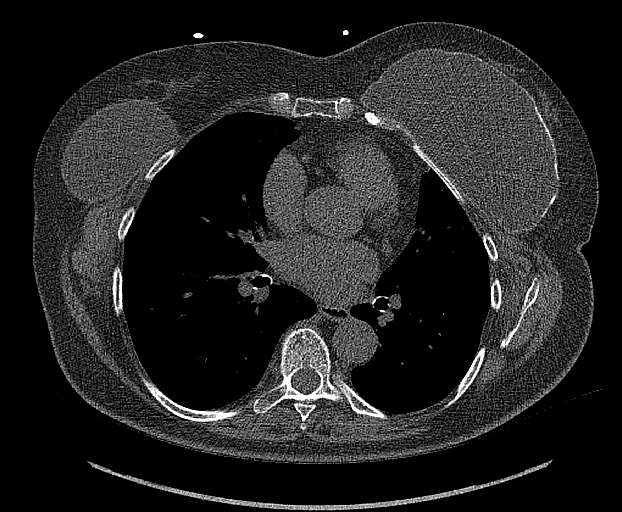
[im 47/70  vessel]
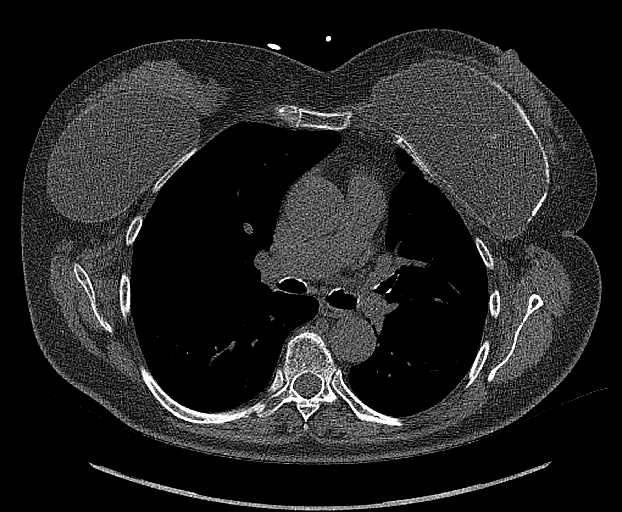
[im 58/70  vessel]
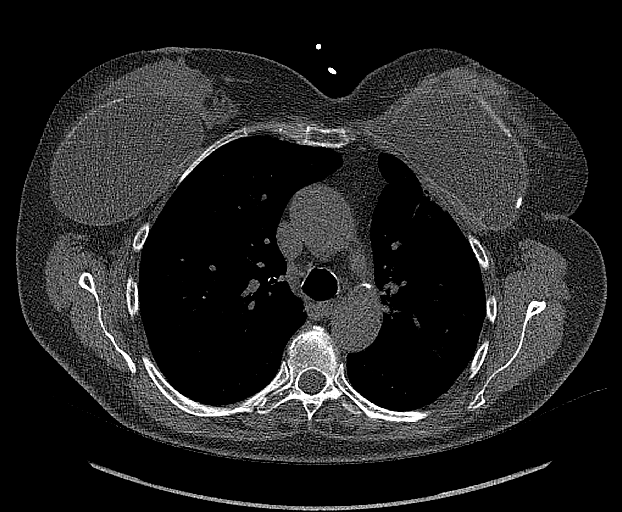

[14 of 20 positions shown; findings below may reference images not displayed]

FINDINGS: CORONARY CALCIUM SCORES:

Left Main: 49

LAD: 7

LCx: 0

RCA: 24

Total Agatston Score: 80

[HOSPITAL] percentile: 67

AORTA MEASUREMENTS:

Ascending Aorta: 34 mm

Descending Aorta: 26 mm

OTHER FINDINGS:

Heart is normal size. No adenopathy. Scattered aortic
calcifications. No aneurysm. Visualized lungs clear. No effusions.
Imaging into the upper abdomen demonstrates no acute findings.
Bilateral breast implants. No acute bony abnormality.
IMPRESSION: The observed calcium score of 80 is at the percentile 67 for
subjects of the same age, gender and race/ethnicity who are free of
clinical cardiovascular disease and treated diabetes.

Aortic atherosclerosis.

No acute extra cardiac abnormality.

## 2021-04-22 DIAGNOSIS — H401133 Primary open-angle glaucoma, bilateral, severe stage: Secondary | ICD-10-CM | POA: Diagnosis not present

## 2021-04-22 DIAGNOSIS — H2513 Age-related nuclear cataract, bilateral: Secondary | ICD-10-CM | POA: Diagnosis not present

## 2021-04-22 DIAGNOSIS — H35373 Puckering of macula, bilateral: Secondary | ICD-10-CM | POA: Diagnosis not present

## 2021-04-22 DIAGNOSIS — Z961 Presence of intraocular lens: Secondary | ICD-10-CM | POA: Diagnosis not present

## 2021-04-22 DIAGNOSIS — H2512 Age-related nuclear cataract, left eye: Secondary | ICD-10-CM | POA: Diagnosis not present

## 2021-06-11 DIAGNOSIS — F324 Major depressive disorder, single episode, in partial remission: Secondary | ICD-10-CM | POA: Diagnosis not present

## 2021-06-11 DIAGNOSIS — E785 Hyperlipidemia, unspecified: Secondary | ICD-10-CM | POA: Diagnosis not present

## 2021-06-11 DIAGNOSIS — Z803 Family history of malignant neoplasm of breast: Secondary | ICD-10-CM | POA: Diagnosis not present

## 2021-06-11 DIAGNOSIS — R32 Unspecified urinary incontinence: Secondary | ICD-10-CM | POA: Diagnosis not present

## 2021-06-11 DIAGNOSIS — Z833 Family history of diabetes mellitus: Secondary | ICD-10-CM | POA: Diagnosis not present

## 2021-06-11 DIAGNOSIS — H409 Unspecified glaucoma: Secondary | ICD-10-CM | POA: Diagnosis not present

## 2021-06-11 DIAGNOSIS — Z8249 Family history of ischemic heart disease and other diseases of the circulatory system: Secondary | ICD-10-CM | POA: Diagnosis not present

## 2021-06-11 DIAGNOSIS — R69 Illness, unspecified: Secondary | ICD-10-CM | POA: Diagnosis not present

## 2021-06-11 DIAGNOSIS — Z853 Personal history of malignant neoplasm of breast: Secondary | ICD-10-CM | POA: Diagnosis not present

## 2021-06-18 ENCOUNTER — Encounter (HOSPITAL_BASED_OUTPATIENT_CLINIC_OR_DEPARTMENT_OTHER): Payer: Self-pay | Admitting: Obstetrics and Gynecology

## 2021-06-18 ENCOUNTER — Encounter (HOSPITAL_COMMUNITY): Payer: Self-pay

## 2021-06-18 ENCOUNTER — Emergency Department (HOSPITAL_COMMUNITY)
Admission: EM | Admit: 2021-06-18 | Discharge: 2021-06-18 | Discharge status: Against Medical Advice | Payer: Medicare HMO | Attending: Emergency Medicine | Admitting: Emergency Medicine

## 2021-06-18 ENCOUNTER — Other Ambulatory Visit: Payer: Self-pay

## 2021-06-18 ENCOUNTER — Emergency Department (HOSPITAL_COMMUNITY): Payer: Medicare HMO

## 2021-06-18 DIAGNOSIS — R9431 Abnormal electrocardiogram [ECG] [EKG]: Secondary | ICD-10-CM | POA: Diagnosis not present

## 2021-06-18 DIAGNOSIS — M940 Chondrocostal junction syndrome [Tietze]: Secondary | ICD-10-CM | POA: Diagnosis not present

## 2021-06-18 DIAGNOSIS — R0602 Shortness of breath: Secondary | ICD-10-CM | POA: Insufficient documentation

## 2021-06-18 DIAGNOSIS — Z5321 Procedure and treatment not carried out due to patient leaving prior to being seen by health care provider: Secondary | ICD-10-CM | POA: Insufficient documentation

## 2021-06-18 DIAGNOSIS — R079 Chest pain, unspecified: Secondary | ICD-10-CM | POA: Insufficient documentation

## 2021-06-18 DIAGNOSIS — R0789 Other chest pain: Secondary | ICD-10-CM | POA: Diagnosis not present

## 2021-06-18 LAB — CBC
HCT: 42.6 % (ref 36.0–46.0)
HCT: 40.3 % (ref 36.0–46.0)
Hemoglobin: 13.6 g/dL (ref 12.0–15.0)
Hemoglobin: 13.2 g/dL (ref 12.0–15.0)
MCH: 28.3 pg (ref 26.0–34.0)
MCH: 28.6 pg (ref 26.0–34.0)
MCHC: 31.9 g/dL (ref 30.0–36.0)
MCHC: 32.8 g/dL (ref 30.0–36.0)
MCV: 88.8 fL (ref 80.0–100.0)
MCV: 87.2 fL (ref 80.0–100.0)
Platelets: 266 10*3/uL (ref 150–400)
Platelets: 283 10*3/uL (ref 150–400)
RBC: 4.8 MIL/uL (ref 3.87–5.11)
RBC: 4.62 MIL/uL (ref 3.87–5.11)
RDW: 13.9 % (ref 11.5–15.5)
RDW: 13.9 % (ref 11.5–15.5)
WBC: 6.7 10*3/uL (ref 4.0–10.5)
WBC: 7.2 10*3/uL (ref 4.0–10.5)
nRBC: 0 % (ref 0.0–0.2)
nRBC: 0 % (ref 0.0–0.2)

## 2021-06-18 LAB — BASIC METABOLIC PANEL
Anion gap: 8 (ref 5–15)
Anion gap: 10 (ref 5–15)
BUN: 13 mg/dL (ref 8–23)
BUN: 10 mg/dL (ref 8–23)
CO2: 26 mmol/L (ref 22–32)
CO2: 24 mmol/L (ref 22–32)
Calcium: 9.8 mg/dL (ref 8.9–10.3)
Calcium: 9.9 mg/dL (ref 8.9–10.3)
Chloride: 102 mmol/L (ref 98–111)
Chloride: 102 mmol/L (ref 98–111)
Creatinine, Ser: 0.55 mg/dL (ref 0.44–1.00)
Creatinine, Ser: 0.62 mg/dL (ref 0.44–1.00)
GFR, Estimated: >60 mL/min (ref >60)
GFR, Estimated: >60 mL/min (ref >60)
Glucose, Bld: 91 mg/dL (ref 70–99)
Glucose, Bld: 102 mg/dL — ABNORMAL HIGH (ref 70–99)
Potassium: 4.2 mmol/L (ref 3.5–5.1)
Potassium: 3.9 mmol/L (ref 3.5–5.1)
Sodium: 136 mmol/L (ref 135–145)
Sodium: 136 mmol/L (ref 135–145)

## 2021-06-18 LAB — TROPONIN I (HIGH SENSITIVITY)
Troponin I (High Sensitivity): 2 ng/L (ref <18)
Troponin I (High Sensitivity): <2 ng/L (ref <18)

## 2021-06-18 NOTE — ED Triage Notes (Signed)
Patient reports that she began having intermittent mid chest pain on Wednesday and again Thursday morning when she woke. Patient is having mid chest pain and SOB today. Patient went to the Walk in clinic and after having an EKG , patient was told to come to the ED for further evaluation.

## 2021-06-18 NOTE — ED Triage Notes (Signed)
Patient reports to the ER for chest pain and states she had an EKG today at [facility] and is concerned. Patient reports she has been having chest pain since Wednesday morning. Patient reports she took tylenol and ibuprofen all day and has continued to have chest pain since then.

## 2021-06-18 NOTE — ED Provider Triage Note (Signed)
Emergency Medicine Provider Triage Evaluation Note  Andrea Villa , a 71 y.o. female  was evaluated in triage.  Pt complains of chest pain.  Yesterday she was woken up by sharp pain in her chest around 2 AM.  She was scared and admits that she should have called 911 however went back to sleep and her pain subsided when she woke up again at 6.  Today she again had the chest pain and went to the [facility] walk-in clinic who did an EKG and said that she is to come to the emergency department due to it being "abnormal."  She currently has chest pain she is describing as pressure.  No history of ACS.  Saw the cardiologist in 2015 after having an abnormal EKG with her primary care.  They did a nuclear stress test which was normal.  She has a personal past medical history of hyperlipidemia and hypercholesterolemia.  Review of Systems  Positive: Chest pain/pressure, shortness of breath Negative:   Physical Exam  BP (!) 140/105 (BP Location: Left Arm)    Pulse 75    Temp 98.2 F (36.8 C) (Oral)    Resp 19    Ht 4' 11.75" (1.518 m)    Wt 59.9 kg    SpO2 98%    BMI 26.00 kg/m  Gen:   Awake, no distress   Resp:  Normal effort, lung sounds clear MSK:   Moves extremities without difficulty  Other:  RRR, no gallops, murmurs or rubs.  Normal sinus on monitor.  Pain is not reproducible, no tenderness to palpation of the chest wall  Medical Decision Making  Medically screening exam initiated at 4:08 PM.  Appropriate orders placed.  Andrea Villa was informed that the remainder of the evaluation will be completed by another provider, this initial triage assessment does not replace that evaluation, and the importance of remaining in the ED until their evaluation is complete.   Concern for ACS, chest pain work-up initiated.   Rhae Hammock, PA-C 06/18/21 1611

## 2021-06-19 ENCOUNTER — Emergency Department (HOSPITAL_BASED_OUTPATIENT_CLINIC_OR_DEPARTMENT_OTHER)
Admission: EM | Admit: 2021-06-19 | Discharge: 2021-06-19 | Disposition: A | Discharge status: Home / Self Care | Payer: Medicare HMO | Source: Home / Self Care

## 2021-06-21 DIAGNOSIS — I700 Atherosclerosis of aorta: Secondary | ICD-10-CM | POA: Diagnosis not present

## 2021-06-21 DIAGNOSIS — E782 Mixed hyperlipidemia: Secondary | ICD-10-CM | POA: Diagnosis not present

## 2021-06-21 DIAGNOSIS — R079 Chest pain, unspecified: Secondary | ICD-10-CM | POA: Diagnosis not present

## 2021-07-06 DIAGNOSIS — I10 Essential (primary) hypertension: Secondary | ICD-10-CM | POA: Insufficient documentation

## 2021-07-06 DIAGNOSIS — I700 Atherosclerosis of aorta: Secondary | ICD-10-CM | POA: Insufficient documentation

## 2021-07-06 NOTE — Progress Notes (Signed)
Cardiology Office Note   Date:  07/07/2021   ID:  Andrea Villa, DOB 25-Feb-1951, MRN [id number]  PCP:  Kathyrn Lass, MD  Cardiologist:   None Referring:  Kathyrn Lass, MD  Chief Complaint  Patient presents with   Chest Pain      History of Present Illness: Andrea Villa is a 71 y.o. female who for evaluation of chest pain.  She has seen me before in 2015.  She had a negative stress echo.  Previously she had work-up around 2003 as well.    She was noted on her calcium score to have a score of about 80 last year.  She has had aortic atherosclerosis noted.  She presented with chest discomfort.  This happened on January 6.  She does feel like she had a pull in her chest.  This was around 2:30 in the morning.  She woke with this.  She said it was about 7 out of 10 in intensity.  It was mid upper chest.  There was some discomfort with deep breathing she.  She was a little nauseated.  She not had this before.  It went away spontaneously after many minutes.  She has not had it since then.  She went to the emergency room at [facility] long but could not wait the 15 hours.  She went to drop.  She and she did get some blood work and I saw this with a negative enzymes.  She does have an EKG that demonstrates some flat to slightly inverted anterior T waves.  However, it was another 15-hour wait and draw a bridge so she left.   She had been working out on the elliptical prior to that somewhat but she has not done that since then.  She does live on the second floor and she says she is short of breath walking up the stairs somewhat.  She is really short of breath if she has to go up 3 flights.  She is not having any resting shortness of breath, PND or orthopnea.  He is not having any palpitations, presyncope or syncope.   Past Medical History:  Diagnosis Date   Abnormal Pap smear    Anxiety    Breast cancer ([place])    Depression    Hyperlipidemia    Migraines     Past Surgical History:  Procedure  Laterality Date   BREAST ENHANCEMENT SURGERY     BREAST LUMPECTOMY     TONSILLECTOMY       Current Outpatient Medications  Medication Sig Dispense Refill   bimatoprost (LUMIGAN) 0.03 % ophthalmic solution 1 drop at bedtime.     buPROPion (WELLBUTRIN XL) 300 MG 24 hr tablet Take 300 mg by mouth daily.     Calcium Carbonate-Vitamin D (CALCIUM + D PO) Take by mouth daily. Citracal     cetirizine (ZYRTEC) 10 MG tablet Take 10 mg by mouth daily.     dorzolamide-timolol (COSOPT) 22.3-6.8 MG/ML ophthalmic solution Place 1 drop into both eyes 2 (two) times daily.     ibuprofen (ADVIL,MOTRIN) 200 MG tablet Take 200 mg by mouth as needed.     meloxicam (MOBIC) 15 MG tablet Take 15 mg by mouth as needed for pain.     metoprolol tartrate (LOPRESSOR) 25 MG tablet TAKE 1 TABLET 2 HOURS PRIOR TO CT 1 tablet 0   rosuvastatin (CRESTOR) 10 MG tablet Take 10 mg by mouth daily.     VITAMIN D, CHOLECALCIFEROL, PO Take 10,000 Units  by mouth. 2-3 daily, per pt     No current facility-administered medications for this visit.    Allergies:   Patient has no known allergies.    Social History:  The patient  reports that she has never smoked. She has never used smokeless tobacco. She reports current alcohol use of about 4.0 standard drinks per week. She reports that she does not use drugs.   Family History:  The patient's family history includes Alcoholism in her father; Breast cancer in her mother; CAD in her daughter; Cancer in her brother; Diabetes in her paternal grandfather; IgA nephropathy in her daughter.    ROS:  Please see the history of present illness.   Otherwise, review of systems are positive for none.   All other systems are reviewed and negative.    PHYSICAL EXAM: VS:  BP 114/78 (BP Location: Left Arm, Patient Position: Sitting, Cuff Size: Normal)    Pulse (!) 58    Ht 4\' 11"  (1.499 m)    Wt 136 lb (61.7 kg)    BMI 27.47 kg/m  , BMI Body mass index is 27.47 kg/m. GENERAL:  Well  appearing HEENT:  Pupils equal round and reactive, fundi not visualized, oral mucosa unremarkable NECK:  No jugular venous distention, waveform within normal limits, carotid upstroke brisk and symmetric, no bruits, no thyromegaly LYMPHATICS:  No cervical, inguinal adenopathy LUNGS:  Clear to auscultation bilaterally BACK:  No CVA tenderness CHEST:  Unremarkable HEART:  PMI not displaced or sustained,S1 and S2 within normal limits, no S3, no S4, no clicks, no rubs, no murmurs ABD:  Flat, positive bowel sounds normal in frequency in pitch, no bruits, no rebound, no guarding, no midline pulsatile mass, no hepatomegaly, no splenomegaly EXT:  2 plus pulses throughout, no edema, no cyanosis no clubbing SKIN:  No rashes no nodules NEURO:  Cranial nerves II through XII grossly intact, motor grossly intact throughout PSYCH:  Cognitively intact, oriented to person place and time    EKG:  EKG is not ordered today. The ekg ordered 06/18/2021 demonstrates sinus rhythm, rate 66, axis within normal limits, possible left atrial enlargement, anterolateral T wave inversions could not exclude ischemia.   Recent Labs: 06/18/2021: BUN 10; Creatinine, Ser 0.62; Hemoglobin 13.2; Platelets 283; Potassium 3.9; Sodium 136    Lipid Panel No results found for: CHOL, TRIG, HDL, CHOLHDL, VLDL, LDLCALC, LDLDIRECT    Wt Readings from Last 3 Encounters:  07/07/21 136 lb (61.7 kg)  06/18/21 132 lb (59.9 kg)  05/22/14 141 lb (64 kg)      Other studies Reviewed: Additional studies/ records that were reviewed today include: ED labs and primary care records. Review of the above records demonstrates:  Please see elsewhere in the note.     ASSESSMENT AND PLAN:  CHEST PAIN: The patient has chest pain.  She has known coronary calcification.  She has aortic atherosclerosis.  There is a moderate pretest probability of obstructive coronary disease and I do not think she be able to walk on a treadmill.  Therefore, she will  have a coronary CT angiogram.  Further evaluation will be based on these results.  HTN: Her blood pressure is controlled and she will continue the meds as listed.  DYSLIPIDEMIA: Goals of therapy will be based on the results of the above.  Of note she has not tolerated higher dose statin but has been Crestor Titrated down the past.  If She Needs Further Management She Might Need a PCSK9 Inhibitor.   Current  medicines are reviewed at length with the patient today.  The patient does not have concerns regarding medicines.  The following changes have been made:  no change  Labs/ tests ordered today include:   Orders Placed This Encounter  Procedures   CT CORONARY MORPH W/CTA COR W/SCORE W/CA W/CM &/OR WO/CM   Basic metabolic panel     Disposition:   FU with me based on the results above.    Signed, Minus Breeding, MD  07/07/2021 10:03 AM    [facility]

## 2021-07-07 ENCOUNTER — Encounter: Payer: Self-pay | Admitting: Cardiology

## 2021-07-07 ENCOUNTER — Other Ambulatory Visit: Payer: Self-pay

## 2021-07-07 ENCOUNTER — Ambulatory Visit (INDEPENDENT_AMBULATORY_CARE_PROVIDER_SITE_OTHER): Payer: Medicare HMO | Admitting: Cardiology

## 2021-07-07 VITALS — BP 114/78 | HR 58 | Ht 59.0 in | Wt 136.0 lb

## 2021-07-07 DIAGNOSIS — I700 Atherosclerosis of aorta: Secondary | ICD-10-CM

## 2021-07-07 DIAGNOSIS — I10 Essential (primary) hypertension: Secondary | ICD-10-CM | POA: Diagnosis not present

## 2021-07-07 DIAGNOSIS — R079 Chest pain, unspecified: Secondary | ICD-10-CM

## 2021-07-07 MED ORDER — METOPROLOL TARTRATE 25 MG PO TABS: ORAL_TABLET | ORAL | 0 refills | Status: DC

## 2021-07-07 NOTE — Patient Instructions (Signed)
Medication Instructions:  TAKE METOPROLOL 25 MG 1 TABLET 2 HOURS PRIOR TO CARDIAC CT   Labwork: BMET 1 WEEK PRIOR TO CT   Testing/Procedures: Your physician has requested that you have cardiac CT. Cardiac computed tomography (CT) is a painless test that uses an x-ray machine to take clear, detailed pictures of your heart. For further information please visit [email or web address]. Please follow instruction sheet as given.  Follow-Up: AS NEEDED   Any Other Special Instructions Will Be Listed Below (If Applicable).  Your cardiac CT will be scheduled at one of the below locations:   [facility] [address] [place], [place] [address] [phone]  [facility] [address] [place], [place] [address] [phone]  If scheduled at [facility], please arrive at the [facility] main entrance (entrance A) of [facility] 30 minutes prior to test start time. You can use the FREE valet parking offered at the main entrance (encouraged to control the heart rate for the test) Proceed to the [facility] Radiology Department (first floor) to check-in and test prep.  If scheduled at [facility], please arrive 15 mins early for check-in and test prep.  Please follow these instructions carefully (unless otherwise directed):  Hold all erectile dysfunction medications at least 3 days (72 hrs) prior to test.  On the Night Before the Test: Be sure to Drink plenty of water. Do not consume any caffeinated/decaffeinated beverages or chocolate 12 hours prior to your test. Do not take any antihistamines 12 hours prior to your test. If the patient has contrast allergy: Patient will need a prescription for Prednisone and very clear instructions (as follows): Prednisone 50 mg - take 13 hours prior to test Take another Prednisone 50 mg 7 hours prior to test Take another Prednisone 50 mg 1 hour prior  to test Take Benadryl 50 mg 1 hour prior to test Patient must complete all four doses of above prophylactic medications. Patient will need a ride after test due to Benadryl.  On the Day of the Test: Drink plenty of water until 1 hour prior to the test. Do not eat any food 4 hours prior to the test. You may take your regular medications prior to the test.  Take metoprolol (Lopressor) two hours prior to test. HOLD Furosemide/Hydrochlorothiazide morning of the test. FEMALES- please wear underwire-free bra if available, avoid dresses & tight clothing      After the Test: Drink plenty of water. After receiving IV contrast, you may experience a mild flushed feeling. This is normal. On occasion, you may experience a mild rash up to 24 hours after the test. This is not dangerous. If this occurs, you can take Benadryl 25 mg and increase your fluid intake. If you experience trouble breathing, this can be serious. If it is severe call 911 IMMEDIATELY. If it is mild, please call our office. If you take any of these medications: Glipizide/Metformin, Avandament, Glucavance, please do not take 48 hours after completing test unless otherwise instructed.  We will call to schedule your test 2-4 weeks out understanding that some insurance companies will need an authorization prior to the service being performed.   For non-scheduling related questions, please contact the cardiac imaging nurse navigator should you have any questions/concerns: Marchia Bond, Cardiac Imaging Nurse Navigator Gordy Clement, Cardiac Imaging Nurse Navigator [facility] Direct Office Dial: [phone]   For scheduling needs, including cancellations and rescheduling, please  call [place], [phone].  Cardiac CT Angiogram A cardiac CT angiogram is a procedure to look at the heart and the area around the heart. It may be done to help find the cause of chest pains or other symptoms of heart disease.  During this procedure, a substance called contrast dye is injected into the blood vessels in the area to be checked. A large X-ray machine, called a CT scanner, then takes detailed pictures of the heart and the surrounding area. The procedure is also sometimes called a coronary CT angiogram, coronary artery scanning, or CTA. A cardiac CT angiogram allows the health care provider to see how well blood is flowing to and from the heart. The health care provider will be able to see if there are any problems, such as: Blockage or narrowing of the coronary arteries in the heart. Fluid around the heart. Signs of weakness or disease in the muscles, valves, and tissues of the heart. Tell a health care provider about: Any allergies you have. This is especially important if you have had a previous allergic reaction to contrast dye. All medicines you are taking, including vitamins, herbs, eye drops, creams, and over-the-counter medicines. Any blood disorders you have. Any surgeries you have had. Any medical conditions you have. Whether you are pregnant or may be pregnant. Any anxiety disorders, chronic pain, or other conditions you have that may increase your stress or prevent you from lying still. What are the risks? Generally, this is a safe procedure. However, problems may occur, including: Bleeding. Infection. Allergic reactions to medicines or dyes. Damage to other structures or organs. Kidney damage from the contrast dye that is used. Increased risk of cancer from radiation exposure. This risk is low. Talk with your health care provider about: The risks and benefits of testing. How you can receive the lowest dose of radiation. What happens before the procedure? Wear comfortable clothing and remove any jewelry, glasses, dentures, and hearing aids. Follow instructions from your health care provider about eating and drinking. This may include: For 12 hours before the procedure -- avoid caffeine.  This includes tea, coffee, soda, energy drinks, and diet pills. Drink plenty of water or other fluids that do not have caffeine in them. Being well hydrated can prevent complications. For 4-6 hours before the procedure -- stop eating and drinking. The contrast dye can cause nausea, but this is less likely if your stomach is empty. Ask your health care provider about changing or stopping your regular medicines. This is especially important if you are taking diabetes medicines, blood thinners, or medicines to treat problems with erections (erectile dysfunction). What happens during the procedure?  Hair on your chest may need to be removed so that small sticky patches called electrodes can be placed on your chest. These will transmit information that helps to monitor your heart during the procedure. An IV will be inserted into one of your veins. You might be given a medicine to control your heart rate during the procedure. This will help to ensure that good images are obtained. You will be asked to lie on an exam table. This table will slide in and out of the CT machine during the procedure. Contrast dye will be injected into the IV. You might feel warm, or you may get a metallic taste in your mouth. You will be given a medicine called nitroglycerin. This will relax or dilate the arteries in your heart. The table that you are lying on will move into the CT machine  tunnel for the scan. The person running the machine will give you instructions while the scans are being done. You may be asked to: Keep your arms above your head. Hold your breath. Stay very still, even if the table is moving. When the scanning is complete, you will be moved out of the machine. The IV will be removed. The procedure may vary among health care providers and hospitals. What can I expect after the procedure? After your procedure, it is common to have: A metallic taste in your mouth from the contrast dye. A feeling of  warmth. A headache from the nitroglycerin. Follow these instructions at home: Take over-the-counter and prescription medicines only as told by your health care provider. If you are told, drink enough fluid to keep your urine pale yellow. This will help to flush the contrast dye out of your body. Most people can return to their normal activities right after the procedure. Ask your health care provider what activities are safe for you. It is up to you to get the results of your procedure. Ask your health care provider, or the department that is doing the procedure, when your results will be ready. Keep all follow-up visits as told by your health care provider. This is important. Contact a health care provider if: You have any symptoms of allergy to the contrast dye. These include: Shortness of breath. Rash or hives. A racing heartbeat. Summary A cardiac CT angiogram is a procedure to look at the heart and the area around the heart. It may be done to help find the cause of chest pains or other symptoms of heart disease. During this procedure, a large X-ray machine, called a CT scanner, takes detailed pictures of the heart and the surrounding area after a contrast dye has been injected into blood vessels in the area. Ask your health care provider about changing or stopping your regular medicines before the procedure. This is especially important if you are taking diabetes medicines, blood thinners, or medicines to treat erectile dysfunction. If you are told, drink enough fluid to keep your urine pale yellow. This will help to flush the contrast dye out of your body. This information is not intended to replace advice given to you by your health care provider. Make sure you discuss any questions you have with your health care provider. Document Revised: 02/10/2021 Document Reviewed: 01/23/2019 Elsevier Patient Education  2022 [employer].

## 2021-07-09 DIAGNOSIS — R310 Gross hematuria: Secondary | ICD-10-CM | POA: Diagnosis not present

## 2021-07-09 DIAGNOSIS — R079 Chest pain, unspecified: Secondary | ICD-10-CM | POA: Diagnosis not present

## 2021-07-09 DIAGNOSIS — I10 Essential (primary) hypertension: Secondary | ICD-10-CM | POA: Diagnosis not present

## 2021-07-10 LAB — BASIC METABOLIC PANEL
BUN/Creatinine Ratio: 20 (ref 12–28)
BUN: 17 mg/dL (ref 8–27)
CO2: 23 mmol/L (ref 20–29)
Calcium: 9.8 mg/dL (ref 8.7–10.3)
Chloride: 105 mmol/L (ref 96–106)
Creatinine, Ser: 0.87 mg/dL (ref 0.57–1.00)
Glucose: 83 mg/dL (ref 70–99)
Potassium: 4.6 mmol/L (ref 3.5–5.2)
Sodium: 140 mmol/L (ref 134–144)
eGFR: 71 mL/min/{1.73_m2} (ref >59)

## 2021-07-14 ENCOUNTER — Telehealth (HOSPITAL_COMMUNITY): Payer: Self-pay | Admitting: Emergency Medicine

## 2021-07-14 NOTE — Telephone Encounter (Signed)
Reaching out to patient to offer assistance regarding upcoming cardiac imaging study; pt verbalizes understanding of appt date/time, parking situation and where to check in, pre-test NPO status and medications ordered, and verified current allergies; name and call back number provided for further questions should they arise Marchia Bond RN Navigator Cardiac Imaging [facility] [phone] office [phone] cell  Resting HR 58 so holding metoprolol tartrate Denies iv issues Arrival 830

## 2021-07-16 ENCOUNTER — Other Ambulatory Visit: Payer: Self-pay

## 2021-07-16 ENCOUNTER — Encounter (HOSPITAL_COMMUNITY): Payer: Self-pay

## 2021-07-16 ENCOUNTER — Ambulatory Visit (HOSPITAL_COMMUNITY)
Admission: RE | Admit: 2021-07-16 | Discharge: 2021-07-16 | Disposition: A | Discharge status: Home / Self Care | Payer: Medicare HMO | Source: Ambulatory Visit | Attending: Cardiology | Admitting: Cardiology

## 2021-07-16 DIAGNOSIS — I700 Atherosclerosis of aorta: Secondary | ICD-10-CM | POA: Insufficient documentation

## 2021-07-16 DIAGNOSIS — R079 Chest pain, unspecified: Secondary | ICD-10-CM | POA: Diagnosis present

## 2021-07-16 DIAGNOSIS — I10 Essential (primary) hypertension: Secondary | ICD-10-CM | POA: Diagnosis present

## 2021-07-16 MED ORDER — NITROGLYCERIN 0.4 MG SL SUBL
SUBLINGUAL_TABLET | SUBLINGUAL | Status: AC
  Filled 2021-07-16: qty 2

## 2021-07-16 MED ORDER — IOHEXOL 350 MG/ML SOLN
95.0000 mL | Freq: Once | INTRAVENOUS | Status: AC | PRN
  Administered 2021-07-16: 95 mL via INTRAVENOUS

## 2021-07-16 MED ORDER — NITROGLYCERIN 0.4 MG SL SUBL
0.8000 mg | SUBLINGUAL_TABLET | Freq: Once | SUBLINGUAL | Status: AC
  Administered 2021-07-16: 0.8 mg via SUBLINGUAL

## 2021-07-23 DIAGNOSIS — H5201 Hypermetropia, right eye: Secondary | ICD-10-CM | POA: Diagnosis not present

## 2021-07-23 DIAGNOSIS — H2512 Age-related nuclear cataract, left eye: Secondary | ICD-10-CM | POA: Diagnosis not present

## 2021-07-23 DIAGNOSIS — Z79899 Other long term (current) drug therapy: Secondary | ICD-10-CM | POA: Diagnosis not present

## 2021-07-23 DIAGNOSIS — H35373 Puckering of macula, bilateral: Secondary | ICD-10-CM | POA: Diagnosis not present

## 2021-07-23 DIAGNOSIS — H401133 Primary open-angle glaucoma, bilateral, severe stage: Secondary | ICD-10-CM | POA: Diagnosis not present

## 2021-07-23 DIAGNOSIS — H524 Presbyopia: Secondary | ICD-10-CM | POA: Diagnosis not present

## 2021-07-23 DIAGNOSIS — H2513 Age-related nuclear cataract, bilateral: Secondary | ICD-10-CM | POA: Diagnosis not present

## 2021-07-23 DIAGNOSIS — Z961 Presence of intraocular lens: Secondary | ICD-10-CM | POA: Diagnosis not present

## 2021-07-23 DIAGNOSIS — Z83511 Family history of glaucoma: Secondary | ICD-10-CM | POA: Diagnosis not present

## 2021-07-27 ENCOUNTER — Telehealth: Payer: Self-pay | Admitting: Cardiology

## 2021-07-27 NOTE — Telephone Encounter (Signed)
Patient would like someone to call her to discuss her recent test results

## 2021-07-27 NOTE — Telephone Encounter (Signed)
Returned call to pt, she states that she will call and discuss the cysts on her CT with her PCP.   She had additional question, she was awakened on Saturday by her [facility] due to her HR being 47 for 10 minutes. This only happened once before back in the fall. She states that her HR runs low anyway  between 55-65. She had her CT about 2 weeks ago and did not have to take the metoprolol before because her HR was low enough this was verified when she got to the hosp then told her that she did not have to take any metoprolol at the hosp either. These are the only times that this has happened. Would you like to make any changes?

## 2021-07-28 NOTE — Telephone Encounter (Signed)
Pt informed of providers result & recommendations. Pt verbalized understanding. No further questions . ? ?

## 2021-08-23 DIAGNOSIS — Z0000 Encounter for general adult medical examination without abnormal findings: Secondary | ICD-10-CM | POA: Diagnosis not present

## 2021-08-23 DIAGNOSIS — R69 Illness, unspecified: Secondary | ICD-10-CM | POA: Diagnosis not present

## 2021-08-23 DIAGNOSIS — G43009 Migraine without aura, not intractable, without status migrainosus: Secondary | ICD-10-CM | POA: Diagnosis not present

## 2021-08-23 DIAGNOSIS — E7800 Pure hypercholesterolemia, unspecified: Secondary | ICD-10-CM | POA: Diagnosis not present

## 2021-08-23 DIAGNOSIS — Z1389 Encounter for screening for other disorder: Secondary | ICD-10-CM | POA: Diagnosis not present

## 2021-08-23 DIAGNOSIS — Z1211 Encounter for screening for malignant neoplasm of colon: Secondary | ICD-10-CM | POA: Diagnosis not present

## 2021-08-23 DIAGNOSIS — E663 Overweight: Secondary | ICD-10-CM | POA: Diagnosis not present

## 2021-08-23 DIAGNOSIS — F325 Major depressive disorder, single episode, in full remission: Secondary | ICD-10-CM | POA: Diagnosis not present

## 2021-08-23 DIAGNOSIS — E559 Vitamin D deficiency, unspecified: Secondary | ICD-10-CM | POA: Diagnosis not present

## 2021-08-23 DIAGNOSIS — I700 Atherosclerosis of aorta: Secondary | ICD-10-CM | POA: Diagnosis not present

## 2021-09-13 DIAGNOSIS — Z1231 Encounter for screening mammogram for malignant neoplasm of breast: Secondary | ICD-10-CM | POA: Diagnosis not present

## 2021-09-20 DIAGNOSIS — Z83511 Family history of glaucoma: Secondary | ICD-10-CM | POA: Diagnosis not present

## 2021-09-20 DIAGNOSIS — Z961 Presence of intraocular lens: Secondary | ICD-10-CM | POA: Diagnosis not present

## 2021-09-20 DIAGNOSIS — H5200 Hypermetropia, unspecified eye: Secondary | ICD-10-CM | POA: Diagnosis not present

## 2021-09-20 DIAGNOSIS — H524 Presbyopia: Secondary | ICD-10-CM | POA: Diagnosis not present

## 2021-09-20 DIAGNOSIS — Z79899 Other long term (current) drug therapy: Secondary | ICD-10-CM | POA: Diagnosis not present

## 2021-09-20 DIAGNOSIS — H2512 Age-related nuclear cataract, left eye: Secondary | ICD-10-CM | POA: Diagnosis not present

## 2021-09-20 DIAGNOSIS — H401133 Primary open-angle glaucoma, bilateral, severe stage: Secondary | ICD-10-CM | POA: Diagnosis not present

## 2021-09-20 DIAGNOSIS — Z01818 Encounter for other preprocedural examination: Secondary | ICD-10-CM | POA: Diagnosis not present

## 2021-09-29 DIAGNOSIS — H401133 Primary open-angle glaucoma, bilateral, severe stage: Secondary | ICD-10-CM | POA: Diagnosis not present

## 2021-09-29 DIAGNOSIS — H401123 Primary open-angle glaucoma, left eye, severe stage: Secondary | ICD-10-CM | POA: Diagnosis not present

## 2021-09-29 DIAGNOSIS — Z961 Presence of intraocular lens: Secondary | ICD-10-CM | POA: Diagnosis not present

## 2021-09-29 DIAGNOSIS — H2512 Age-related nuclear cataract, left eye: Secondary | ICD-10-CM | POA: Diagnosis not present

## 2021-09-29 DIAGNOSIS — H401120 Primary open-angle glaucoma, left eye, stage unspecified: Secondary | ICD-10-CM | POA: Diagnosis not present

## 2021-09-29 DIAGNOSIS — Z9841 Cataract extraction status, right eye: Secondary | ICD-10-CM | POA: Diagnosis not present

## 2021-10-08 DIAGNOSIS — H401133 Primary open-angle glaucoma, bilateral, severe stage: Secondary | ICD-10-CM | POA: Diagnosis not present

## 2021-10-08 DIAGNOSIS — Z48810 Encounter for surgical aftercare following surgery on the sense organs: Secondary | ICD-10-CM | POA: Diagnosis not present

## 2021-10-08 DIAGNOSIS — H35373 Puckering of macula, bilateral: Secondary | ICD-10-CM | POA: Diagnosis not present

## 2021-10-08 DIAGNOSIS — Z83511 Family history of glaucoma: Secondary | ICD-10-CM | POA: Diagnosis not present

## 2021-10-08 DIAGNOSIS — Z79899 Other long term (current) drug therapy: Secondary | ICD-10-CM | POA: Diagnosis not present

## 2021-10-08 DIAGNOSIS — H524 Presbyopia: Secondary | ICD-10-CM | POA: Diagnosis not present

## 2021-10-08 DIAGNOSIS — H5200 Hypermetropia, unspecified eye: Secondary | ICD-10-CM | POA: Diagnosis not present

## 2021-10-08 DIAGNOSIS — Z961 Presence of intraocular lens: Secondary | ICD-10-CM | POA: Diagnosis not present

## 2021-11-19 DIAGNOSIS — D3131 Benign neoplasm of right choroid: Secondary | ICD-10-CM | POA: Diagnosis not present

## 2021-11-19 DIAGNOSIS — H401133 Primary open-angle glaucoma, bilateral, severe stage: Secondary | ICD-10-CM | POA: Diagnosis not present

## 2021-11-19 DIAGNOSIS — Z79899 Other long term (current) drug therapy: Secondary | ICD-10-CM | POA: Diagnosis not present

## 2021-11-19 DIAGNOSIS — Z961 Presence of intraocular lens: Secondary | ICD-10-CM | POA: Diagnosis not present

## 2021-11-19 DIAGNOSIS — H35373 Puckering of macula, bilateral: Secondary | ICD-10-CM | POA: Diagnosis not present

## 2021-11-19 DIAGNOSIS — Z48810 Encounter for surgical aftercare following surgery on the sense organs: Secondary | ICD-10-CM | POA: Diagnosis not present

## 2022-02-25 DIAGNOSIS — D3131 Benign neoplasm of right choroid: Secondary | ICD-10-CM | POA: Diagnosis not present

## 2022-02-25 DIAGNOSIS — Z79899 Other long term (current) drug therapy: Secondary | ICD-10-CM | POA: Diagnosis not present

## 2022-02-25 DIAGNOSIS — H401133 Primary open-angle glaucoma, bilateral, severe stage: Secondary | ICD-10-CM | POA: Diagnosis not present

## 2022-02-25 DIAGNOSIS — Z961 Presence of intraocular lens: Secondary | ICD-10-CM | POA: Diagnosis not present

## 2022-02-25 DIAGNOSIS — H35373 Puckering of macula, bilateral: Secondary | ICD-10-CM | POA: Diagnosis not present

## 2022-02-25 DIAGNOSIS — H04123 Dry eye syndrome of bilateral lacrimal glands: Secondary | ICD-10-CM | POA: Diagnosis not present

## 2022-02-25 DIAGNOSIS — Z83511 Family history of glaucoma: Secondary | ICD-10-CM | POA: Diagnosis not present

## 2022-06-20 DIAGNOSIS — H401133 Primary open-angle glaucoma, bilateral, severe stage: Secondary | ICD-10-CM | POA: Diagnosis not present

## 2022-06-20 DIAGNOSIS — Z961 Presence of intraocular lens: Secondary | ICD-10-CM | POA: Diagnosis not present

## 2022-06-20 DIAGNOSIS — H35373 Puckering of macula, bilateral: Secondary | ICD-10-CM | POA: Diagnosis not present

## 2022-06-20 DIAGNOSIS — D3131 Benign neoplasm of right choroid: Secondary | ICD-10-CM | POA: Diagnosis not present

## 2022-08-26 DIAGNOSIS — R5383 Other fatigue: Secondary | ICD-10-CM | POA: Diagnosis not present

## 2022-08-26 DIAGNOSIS — Z1389 Encounter for screening for other disorder: Secondary | ICD-10-CM | POA: Diagnosis not present

## 2022-08-26 DIAGNOSIS — Z0000 Encounter for general adult medical examination without abnormal findings: Secondary | ICD-10-CM | POA: Diagnosis not present

## 2022-08-26 DIAGNOSIS — R350 Frequency of micturition: Secondary | ICD-10-CM | POA: Diagnosis not present

## 2022-08-26 DIAGNOSIS — E7800 Pure hypercholesterolemia, unspecified: Secondary | ICD-10-CM | POA: Diagnosis not present

## 2022-08-26 DIAGNOSIS — Z79899 Other long term (current) drug therapy: Secondary | ICD-10-CM | POA: Diagnosis not present

## 2022-08-26 DIAGNOSIS — Z6828 Body mass index (BMI) 28.0-28.9, adult: Secondary | ICD-10-CM | POA: Diagnosis not present

## 2022-09-02 DIAGNOSIS — I700 Atherosclerosis of aorta: Secondary | ICD-10-CM | POA: Diagnosis not present

## 2022-09-02 DIAGNOSIS — Z23 Encounter for immunization: Secondary | ICD-10-CM | POA: Diagnosis not present

## 2022-09-02 DIAGNOSIS — R69 Illness, unspecified: Secondary | ICD-10-CM | POA: Diagnosis not present

## 2022-09-02 DIAGNOSIS — E663 Overweight: Secondary | ICD-10-CM | POA: Diagnosis not present

## 2022-09-26 DIAGNOSIS — H401133 Primary open-angle glaucoma, bilateral, severe stage: Secondary | ICD-10-CM | POA: Diagnosis not present

## 2022-09-30 DIAGNOSIS — Z853 Personal history of malignant neoplasm of breast: Secondary | ICD-10-CM | POA: Diagnosis not present

## 2022-09-30 DIAGNOSIS — M8589 Other specified disorders of bone density and structure, multiple sites: Secondary | ICD-10-CM | POA: Diagnosis not present

## 2022-09-30 DIAGNOSIS — Z1231 Encounter for screening mammogram for malignant neoplasm of breast: Secondary | ICD-10-CM | POA: Diagnosis not present

## 2022-10-10 DIAGNOSIS — M8580 Other specified disorders of bone density and structure, unspecified site: Secondary | ICD-10-CM | POA: Diagnosis not present

## 2022-10-10 DIAGNOSIS — R001 Bradycardia, unspecified: Secondary | ICD-10-CM | POA: Diagnosis not present

## 2022-10-10 DIAGNOSIS — Z6828 Body mass index (BMI) 28.0-28.9, adult: Secondary | ICD-10-CM | POA: Diagnosis not present

## 2022-10-24 DIAGNOSIS — H401133 Primary open-angle glaucoma, bilateral, severe stage: Secondary | ICD-10-CM | POA: Diagnosis not present

## 2022-12-01 DIAGNOSIS — Z961 Presence of intraocular lens: Secondary | ICD-10-CM | POA: Diagnosis not present

## 2022-12-01 DIAGNOSIS — H35373 Puckering of macula, bilateral: Secondary | ICD-10-CM | POA: Diagnosis not present

## 2022-12-01 DIAGNOSIS — H401133 Primary open-angle glaucoma, bilateral, severe stage: Secondary | ICD-10-CM | POA: Diagnosis not present

## 2022-12-01 DIAGNOSIS — D3131 Benign neoplasm of right choroid: Secondary | ICD-10-CM | POA: Diagnosis not present

## 2023-01-24 DIAGNOSIS — H905 Unspecified sensorineural hearing loss: Secondary | ICD-10-CM | POA: Diagnosis not present

## 2023-01-24 DIAGNOSIS — Z6827 Body mass index (BMI) 27.0-27.9, adult: Secondary | ICD-10-CM | POA: Diagnosis not present

## 2023-01-24 DIAGNOSIS — H6123 Impacted cerumen, bilateral: Secondary | ICD-10-CM | POA: Diagnosis not present

## 2023-02-27 DIAGNOSIS — H401133 Primary open-angle glaucoma, bilateral, severe stage: Secondary | ICD-10-CM | POA: Diagnosis not present

## 2023-03-17 DIAGNOSIS — H90A32 Mixed conductive and sensorineural hearing loss, unilateral, left ear with restricted hearing on the contralateral side: Secondary | ICD-10-CM | POA: Diagnosis not present

## 2023-03-17 DIAGNOSIS — H6993 Unspecified Eustachian tube disorder, bilateral: Secondary | ICD-10-CM | POA: Diagnosis not present

## 2023-04-03 DIAGNOSIS — H401133 Primary open-angle glaucoma, bilateral, severe stage: Secondary | ICD-10-CM | POA: Diagnosis not present

## 2023-05-02 DIAGNOSIS — R69 Illness, unspecified: Secondary | ICD-10-CM | POA: Diagnosis not present

## 2023-08-23 DIAGNOSIS — E782 Mixed hyperlipidemia: Secondary | ICD-10-CM | POA: Diagnosis not present

## 2023-08-26 DIAGNOSIS — H401133 Primary open-angle glaucoma, bilateral, severe stage: Secondary | ICD-10-CM | POA: Diagnosis not present

## 2023-08-28 DIAGNOSIS — Z6828 Body mass index (BMI) 28.0-28.9, adult: Secondary | ICD-10-CM | POA: Diagnosis not present

## 2023-08-28 DIAGNOSIS — Z1211 Encounter for screening for malignant neoplasm of colon: Secondary | ICD-10-CM | POA: Diagnosis not present

## 2023-08-28 DIAGNOSIS — Z0000 Encounter for general adult medical examination without abnormal findings: Secondary | ICD-10-CM | POA: Diagnosis not present

## 2023-09-13 DIAGNOSIS — Z1211 Encounter for screening for malignant neoplasm of colon: Secondary | ICD-10-CM | POA: Diagnosis not present

## 2023-10-06 DIAGNOSIS — Z1231 Encounter for screening mammogram for malignant neoplasm of breast: Secondary | ICD-10-CM | POA: Diagnosis not present

## 2023-10-16 ENCOUNTER — Other Ambulatory Visit: Payer: Self-pay | Admitting: Family Medicine

## 2023-10-16 DIAGNOSIS — N3000 Acute cystitis without hematuria: Secondary | ICD-10-CM | POA: Diagnosis not present

## 2023-10-16 DIAGNOSIS — M8580 Other specified disorders of bone density and structure, unspecified site: Secondary | ICD-10-CM | POA: Diagnosis not present

## 2023-10-16 DIAGNOSIS — Z9189 Other specified personal risk factors, not elsewhere classified: Secondary | ICD-10-CM

## 2023-10-16 DIAGNOSIS — Z6828 Body mass index (BMI) 28.0-28.9, adult: Secondary | ICD-10-CM | POA: Diagnosis not present

## 2023-11-17 ENCOUNTER — Encounter: Payer: Self-pay | Admitting: Family Medicine

## 2023-11-19 ENCOUNTER — Ambulatory Visit
Admission: RE | Admit: 2023-11-19 | Discharge: 2023-11-19 | Disposition: A | Discharge status: Home / Self Care | Source: Ambulatory Visit | Attending: Family Medicine | Admitting: Family Medicine

## 2023-11-19 DIAGNOSIS — Z9189 Other specified personal risk factors, not elsewhere classified: Secondary | ICD-10-CM

## 2023-11-19 DIAGNOSIS — Z853 Personal history of malignant neoplasm of breast: Secondary | ICD-10-CM | POA: Diagnosis not present

## 2023-11-19 DIAGNOSIS — Z1239 Encounter for other screening for malignant neoplasm of breast: Secondary | ICD-10-CM | POA: Diagnosis not present

## 2023-11-19 MED ORDER — GADOPICLENOL 0.5 MMOL/ML IV SOLN
6.0000 mL | Freq: Once | INTRAVENOUS | Status: AC | PRN
  Administered 2023-11-19: 6 mL via INTRAVENOUS

## 2024-01-01 DIAGNOSIS — H401133 Primary open-angle glaucoma, bilateral, severe stage: Secondary | ICD-10-CM | POA: Diagnosis not present

## 2024-04-03 DIAGNOSIS — H401133 Primary open-angle glaucoma, bilateral, severe stage: Secondary | ICD-10-CM | POA: Diagnosis not present
